# Patient Record
Sex: Female | Born: 1970
Health system: Southern US, Community
[De-identification: ages and names within clinical notes are randomized; demographics above are authoritative.]

## PROBLEM LIST (undated history)

## (undated) DIAGNOSIS — E039 Hypothyroidism, unspecified: Secondary | ICD-10-CM

## (undated) DIAGNOSIS — F419 Anxiety disorder, unspecified: Secondary | ICD-10-CM

---

## 1998-08-29 ENCOUNTER — Other Ambulatory Visit: Admission: RE | Admit: 1998-08-29 | Discharge: 1998-08-29 | Payer: Self-pay | Admitting: Obstetrics and Gynecology

## 1999-01-22 ENCOUNTER — Inpatient Hospital Stay (HOSPITAL_COMMUNITY): Admission: AD | Admit: 1999-01-22 | Discharge: 1999-01-24 | Payer: Self-pay | Admitting: *Deleted

## 1999-01-23 ENCOUNTER — Encounter: Payer: Self-pay | Admitting: *Deleted

## 1999-01-24 ENCOUNTER — Encounter: Payer: Self-pay | Admitting: *Deleted

## 1999-03-17 ENCOUNTER — Inpatient Hospital Stay (HOSPITAL_COMMUNITY): Admission: AD | Admit: 1999-03-17 | Discharge: 1999-03-20 | Payer: Self-pay | Admitting: Obstetrics and Gynecology

## 1999-04-26 ENCOUNTER — Other Ambulatory Visit: Admission: RE | Admit: 1999-04-26 | Discharge: 1999-04-26 | Payer: Self-pay | Admitting: Obstetrics and Gynecology

## 2000-05-06 ENCOUNTER — Other Ambulatory Visit: Admission: RE | Admit: 2000-05-06 | Discharge: 2000-05-06 | Payer: Self-pay | Admitting: Obstetrics and Gynecology

## 2001-04-21 ENCOUNTER — Other Ambulatory Visit: Admission: RE | Admit: 2001-04-21 | Discharge: 2001-04-21 | Payer: Self-pay | Admitting: Obstetrics and Gynecology

## 2001-06-24 ENCOUNTER — Observation Stay (HOSPITAL_COMMUNITY): Admission: AD | Admit: 2001-06-24 | Discharge: 2001-06-24 | Payer: Self-pay | Admitting: Obstetrics & Gynecology

## 2001-11-12 ENCOUNTER — Inpatient Hospital Stay (HOSPITAL_COMMUNITY): Admission: AD | Admit: 2001-11-12 | Discharge: 2001-11-14 | Payer: Self-pay | Admitting: Obstetrics and Gynecology

## 2001-11-22 ENCOUNTER — Inpatient Hospital Stay (HOSPITAL_COMMUNITY): Admission: AD | Admit: 2001-11-22 | Discharge: 2001-11-22 | Payer: Self-pay | Admitting: Obstetrics and Gynecology

## 2001-12-30 ENCOUNTER — Other Ambulatory Visit: Admission: RE | Admit: 2001-12-30 | Discharge: 2001-12-30 | Payer: Self-pay | Admitting: Obstetrics and Gynecology

## 2003-03-09 ENCOUNTER — Other Ambulatory Visit: Admission: RE | Admit: 2003-03-09 | Discharge: 2003-03-09 | Payer: Self-pay | Admitting: Obstetrics and Gynecology

## 2003-03-16 ENCOUNTER — Ambulatory Visit (HOSPITAL_COMMUNITY): Admission: RE | Admit: 2003-03-16 | Discharge: 2003-03-16 | Payer: Self-pay | Admitting: Family Medicine

## 2003-03-16 ENCOUNTER — Encounter: Payer: Self-pay | Admitting: Family Medicine

## 2004-11-28 ENCOUNTER — Inpatient Hospital Stay (HOSPITAL_COMMUNITY): Admission: AD | Admit: 2004-11-28 | Discharge: 2004-11-30 | Payer: Self-pay | Admitting: Obstetrics and Gynecology

## 2007-07-01 ENCOUNTER — Inpatient Hospital Stay (HOSPITAL_COMMUNITY): Admission: AD | Admit: 2007-07-01 | Discharge: 2007-07-01 | Payer: Self-pay | Admitting: *Deleted

## 2007-08-08 ENCOUNTER — Encounter: Admission: RE | Admit: 2007-08-08 | Discharge: 2007-08-08 | Payer: Self-pay | Admitting: Obstetrics and Gynecology

## 2007-12-10 ENCOUNTER — Inpatient Hospital Stay (HOSPITAL_COMMUNITY): Admission: AD | Admit: 2007-12-10 | Discharge: 2007-12-11 | Payer: Self-pay | Admitting: Obstetrics

## 2007-12-12 ENCOUNTER — Encounter: Admission: RE | Admit: 2007-12-12 | Discharge: 2007-12-15 | Payer: Self-pay | Admitting: Obstetrics and Gynecology

## 2010-10-24 NOTE — H&P (Signed)
Brianna Hodge, Brianna Hodge               ACCOUNT NO.:  [id number]   MEDICAL RECORD NO.:  [id number]          PATIENT TYPE:  INP   LOCATION:  9134                          FACILITY:  [facility]   PHYSICIAN:  Lenoard Aden, M.D.DATE OF BIRTH:  Aug 29, 1970   DATE OF ADMISSION:  12/10/2007  DATE OF DISCHARGE:                              HISTORY & PHYSICAL   CHIEF COMPLAINT:  Labor.   HISTORY OF PRESENT ILLNESS:  She is a 40 year old white female G4, P3 at  74th weeks' gestation who presents to spontaneous onset of labor.  She  has a history of LGA and polyhydramnios.   ALLERGIES:  She has no known drug allergies.   MEDICATIONS:  Prenatal vitamins and Effexor.   PAST MEDICAL HISTORY:  She has history of complicated depression, on  long-term Effexor use.   SOCIAL HISTORY:  She is a nonsmoker and nondrinker.  She denies domestic  or physical violence.  She has a history of 3 complicated vaginal  deliveries.  One complicated by polyhydramnios and two complicated by  oligohydramnios.   FAMILY HISTORY:  Remarkable for heart disease, diabetes, and OCD.   PHYSICAL EXAMINATION:  GENERAL:  She is well-developed, well-nourished  white female in no acute distress.  HEENT:  Normal.  LUNGS:  Clear.  HEART:  Regular rhythm.  ABDOMEN:  Soft.  Gravid.  Nontender.  Estimated fetal weight 9-1/2 to 10  pounds.  Cervix is 5, 100% vertex at a zero station.  EXTREMITIES:  There are no cords.  NEUROLOGIC:  Nonfocal.  SKIN:  Intact.   IMPRESSION:  Term intrauterine pregnancy in active labor, probable  macrosomia.   PLAN:  Anticipate to attempt vaginal delivery.      Lenoard Aden, M.D.  Electronically Signed    RJT/MEDQ  D:  12/10/2007  T:  12/10/2007  Job:  [id number]

## 2010-10-27 NOTE — H&P (Signed)
[facility]  Patient:    Brianna Hodge, Brianna Hodge Visit Number: [id number] MRN: [id number]          Service Type: OBS Location: 910B 9162 01 Attending Physician:  Maxie Better Dictated by:   Lenoard Aden, M.D. Admit Date:  11/12/2001   CC:         [facility]   History and Physical  CHIEF COMPLAINT:              Oligohydramnios.  HISTORY OF PRESENT ILLNESS:   This is a 40 year old white female, G2 P15, EDD November 22, 2001, at 39 weeks, with AFI less than the 10th percentile, at 39+ weeks for induction.  ALLERGIES:                    AMOXICILLIN.  MEDICATIONS:                  Prenatal vitamins.  OBSTETRICAL HISTORY:          History of 7 pounds 3 ounce female born in October 2002.  No other medical or surgical hospitalizations.  PAST MEDICAL HISTORY:         1. History of mitral valve prolapse.  She has                                  had a normal echocardiogram, normal work-up.                               2. Previous history of anxiety, on Luvox.                               3. No other medical or surgical hospitalization.  PRENATAL LABORATORY DATA:     Blood type A-negative.  Rh antibody negative. Toxo titer negative.  Rubella immune.  Hepatitis B surface antigen negative. GC and Chlamydia negative.  GBS negative.  PHYSICAL EXAMINATION:  GENERAL:                      She is a well-developed, well-nourished white female in no apparent distress.  HEENT:                        Normal.  LUNGS:                        Clear.  HEART:                        Regular rate and rhythm.  ABDOMEN:                      Soft, gravid, nontender.  PELVIC:                       Cervix is 4-5 cm, 2 cm long, soft, vertex -1. Artificial rupture of membranes reveals light meconium.  EXTREMITIES:                  No cords.  NEUROLOGIC:                   Examination nonfocal.  IMPRESSION:  1. Term intrauterine pregnancy  with                                  oligohydramnios.                               2. Light meconium.                               3. Spontaneous rupture of membranes.  PLAN:                         1. Proceed with Pitocin augmentation.                               2. Anticipate attempts at vaginal delivery.                               3. Epidural p.r.n. Dictated by:   Lenoard Aden, M.D. Attending Physician:  Maxie Better DD:  11/12/01 TD:  11/12/01 Job: [id number]

## 2010-10-27 NOTE — H&P (Signed)
[facility]  Patient:    Brianna Hodge                       MRN: [id number] Adm. Date:  [id number] Attending:  Lenoard Aden CC:         [facility]                         History and Physical  ADMISSION DIAGNOSIS:  Oligohydramnios.  HISTORY OF PRESENT ILLNESS:  A 40 year old white female, G1, P0.  Estimated date of delivery April 01, 1999 at 37+ weeks, who presents with persistent oligohydramnios.  PAST MEDICAL HISTORY:  Contributory for a history of MVP with normal cardiac echocardiogram.  History of depression on Luvox.  MEDICATIONS:  Prenatal vitamins, iron and Luvox.  ALLERGIES:  AMOXICILLIN, which gives her a rash.  FAMILY HISTORY:  Heart disease, diabetes and brain tumor.  PREGNANCY DATA:  Blood type A-, Rh antibody negative, toxoplasmosis negative, VDRL nonreactive, rubella immune, hepatitis B surface antigen negative.  GBS performed was negative.  Pregnancy complicated by depression on stable on Luvox and history of unexplained second trimester bleeding and history of oligohydramnios.  PHYSICAL EXAMINATION:  GENERAL:  Well-developed, well-nourished white female.  No apparent distress.  HEENT:  Normal.  LUNGS:  Clear.  HEART:  Regular rate and rhythm.  ABDOMEN:  Soft.  Gravid and nontender.  Estimated fetal weight of 7-1/2 pounds.    PELVIC:  The cervix is 2-3 cm, 2.5 cm on vertex -1.  Artificial rupture of membranes with minimal, but clear fluid.  EXTREMITIES:  There are no cords.  NEUROLOGIC:  Nonfocal.  IMPRESSION: 1. A 37-week intrauterine pregnancy. 2. Persistent oligohydramnios. 3. Unexplained second trimester bleeding.  PLAN:  Proceed with artificial rupture of membranes and Pitocin induction. DD:  03/17/99 TD:  03/17/99 Job: [id number]

## 2010-10-27 NOTE — Consult Note (Signed)
[facility]  Patient:    Brianna Hodge, Brianna Hodge Visit Number: [id number] MRN: [id number]          Service Type: OBS Location: 9300 9306 01 Attending Physician:  Genia Del Dictated by:   Lenoard Aden, M.D. Consultation Date: 06/24/01 Admit Date:  06/24/2001 Discharge Date: 06/24/2001   CC:         [facility]   Consultation Report  EMERGENCY ROOM  CHIEF COMPLAINT:              Nausea and vomiting, possible GI illness.  HISTORY OF PRESENT ILLNESS:   The patient is a 40 year old Caucasian female, G2, P1, [redacted] weeks gestation with a 24-hour history of nausea, vomiting, diarrhea, and questionable viral gastroenteritis.  PAST MEDICAL HISTORY:         Remarkable for vaginal delivery.  FAMILY HISTORY:               Noncontributory.  SOCIAL HISTORY:               Noncontributory.  ALLERGIES:                    AMOXICILLIN.  MEDICATIONS:                  Prenatal vitamins.  PHYSICAL EXAMINATION:  GENERAL:                      She is a well-developed, well-nourished, white female in no apparent distress.  VITAL SIGNS:                  Blood pressure 110/60.  NECK/BACK:                    No thyromegaly.  No CVA tenderness.  ABDOMEN:                      Soft, gravid, nontender.  Bowel sounds x 4 heard.  EXTREMITIES:                  No cords.  NEUROLOGIC:                   Nonfocal.  SKIN:                         Intact.  PELVIC:                       Cervical exam reveals cervix to be closed, long, presenting part out of the pelvis.  Fetal heart tones are noted.  LABORATORY DATA:              Urinalysis consistent with trace ketones and specific gravity greater than 1.030.  Laboratory values pending.  IMPRESSION:                   Viral gastroenteritis.  PLAN:                         IV fluids with Zofran IV, Imodium p.r.n.. Discharge home on Zofran ODT 8 mg p.o. t.i.d.  Follow up in office in one to two weeks or earlier  as needed.  Fluid hydration discussed. Dictated by:   Lenoard Aden, M.D. Attending Physician:  Genia Del DD:  06/24/01 TD:  06/25/01 Job: [id number]

## 2010-10-27 NOTE — H&P (Signed)
Brianna Hodge, Brianna Hodge               ACCOUNT NO.:  [id number]   MEDICAL RECORD NO.:  [id number]          PATIENT TYPE:  INP   LOCATION:  9165                          FACILITY:  [facility]   PHYSICIAN:  Lenoard Aden, M.D.DATE OF BIRTH:  06-20-1970   DATE OF ADMISSION:  11/28/2004  DATE OF DISCHARGE:                                HISTORY & PHYSICAL   CHIEF COMPLAINT:  Presumptive LGAfor induction.   HISTORY OF PRESENT ILLNESS:  A 40 year old white female, G3, P2, EDD December 10, 2004, at 38+ weeks for induction.  She has a history of allergies to  AMOXICILLIN.  Medications are prenatal vitamins, Lexapro.  History of 7  pound 3 ounce female in 2000 and 8 pound female in 2003.  No other medical  or surgical hospitalizations.  She has a history of anxiety for which she  takes Lexapro.   FAMILY HISTORY:  Diabetes, heart disease, no other medical or surgical  hospitalizations.   PRENATAL LABORATORY DATA:  Within normal limits.  GBS is negative.   PHYSICAL EXAMINATION:  HEENT:  Normal.  LUNGS:  Clear.  HEART:  Regular rate and rhythm.  ABDOMEN:  Soft, gravid, nontender.  Estimated fetal weight about 8.5 pounds.  PELVIC:  Cervix is 2 cm, 50% vertex and -2.  EXTREMITIES:  No cords.  NEUROLOGIC:  Nonfocal.   IMPRESSION:  1.  A 38 week intrauterine pregnancy.  2.  Presumptive large for gestational age with favorable cervix for      induction.  3.  History of depression, on antidepressant therapy.  Withdrawal symptoms      previously discussed.   PLAN:  Proceed with Pitocin induction.  Risks and benefits discussed.  Epidural as needed.       RJT/MEDQ  D:  11/28/2004  T:  11/28/2004  Job:  [id number]

## 2011-03-01 LAB — URINALYSIS, ROUTINE W REFLEX MICROSCOPIC
Bilirubin Urine: NEGATIVE
Glucose, UA: NEGATIVE
Hgb urine dipstick: NEGATIVE
Ketones, ur: NEGATIVE
Nitrite: NEGATIVE
Protein, ur: NEGATIVE
Specific Gravity, Urine: 1.015
Urobilinogen, UA: 0.2
pH: 7

## 2011-03-01 LAB — URINE CULTURE: Colony Count: 80000

## 2011-03-08 LAB — CBC
HCT: 28.7 — ABNORMAL LOW
HCT: 32 — ABNORMAL LOW
Hemoglobin: 10.7 — ABNORMAL LOW
MCHC: 33.3
MCV: 84.5
MCV: 84.6
Platelets: 245
Platelets: 297
RBC: 3.79 — ABNORMAL LOW
RDW: 15.2
RDW: 15.2
WBC: 14.6 — ABNORMAL HIGH

## 2011-11-19 ENCOUNTER — Other Ambulatory Visit: Payer: Self-pay | Admitting: Gynecology

## 2011-11-19 DIAGNOSIS — R928 Other abnormal and inconclusive findings on diagnostic imaging of breast: Secondary | ICD-10-CM

## 2011-12-03 ENCOUNTER — Ambulatory Visit
Admission: RE | Admit: 2011-12-03 | Discharge: 2011-12-03 | Disposition: A | Discharge status: Home / Self Care | Payer: BC Managed Care – PPO | Source: Ambulatory Visit | Attending: Gynecology | Admitting: Gynecology

## 2011-12-03 DIAGNOSIS — R928 Other abnormal and inconclusive findings on diagnostic imaging of breast: Secondary | ICD-10-CM

## 2012-08-25 ENCOUNTER — Other Ambulatory Visit: Payer: Self-pay | Admitting: Gynecology

## 2012-09-05 ENCOUNTER — Ambulatory Visit
Admission: RE | Admit: 2012-09-05 | Discharge: 2012-09-05 | Disposition: A | Discharge status: Home / Self Care | Payer: BC Managed Care – PPO | Source: Ambulatory Visit | Attending: Gynecology | Admitting: Gynecology

## 2012-09-05 DIAGNOSIS — R921 Mammographic calcification found on diagnostic imaging of breast: Secondary | ICD-10-CM

## 2015-06-14 MED FILL — LARIN FE 1-20 TABLET: 1-20 | 56 days supply | Qty: 56 | Fill #0

## 2015-06-23 MED FILL — VENLAFAXINE HCL ER 150 MG C: 150 | 30 days supply | Qty: 30 | Fill #0

## 2015-07-08 MED FILL — VENLAFAXINE HCL ER 150 MG C: 150 | 30 days supply | Qty: 30 | Fill #1

## 2015-08-03 MED FILL — VENLAFAXINE HCL ER 150 MG C: 150 | 30 days supply | Qty: 30 | Fill #2

## 2015-08-17 MED FILL — NORETHIN-ESTRAD-FERR 1-0.02: 1-20 | 28 days supply | Qty: 28 | Fill #0

## 2015-09-05 MED FILL — VENLAFAXINE HCL ER 150 MG C: 150 | 30 days supply | Qty: 30 | Fill #0

## 2015-10-05 MED FILL — NORETHIN-ESTRAD-FERR 1-0.02: 1-20 | 28 days supply | Qty: 28 | Fill #0

## 2015-10-05 MED FILL — VENLAFAXINE HCL ER 150 MG C: 150 | 30 days supply | Qty: 30 | Fill #0

## 2015-10-25 ENCOUNTER — Other Ambulatory Visit: Payer: Self-pay | Admitting: Nurse Practitioner

## 2015-10-25 DIAGNOSIS — Z1329 Encounter for screening for other suspected endocrine disorder: Secondary | ICD-10-CM | POA: Diagnosis not present

## 2015-10-25 DIAGNOSIS — N6002 Solitary cyst of left breast: Secondary | ICD-10-CM | POA: Diagnosis not present

## 2015-10-25 DIAGNOSIS — Z1321 Encounter for screening for nutritional disorder: Secondary | ICD-10-CM | POA: Diagnosis not present

## 2015-10-25 DIAGNOSIS — N63 Unspecified lump in unspecified breast: Secondary | ICD-10-CM

## 2015-10-25 DIAGNOSIS — Z6826 Body mass index (BMI) 26.0-26.9, adult: Secondary | ICD-10-CM | POA: Diagnosis not present

## 2015-10-25 DIAGNOSIS — Z130 Encounter for screening for diseases of the blood and blood-forming organs and certain disorders involving the immune mechanism: Secondary | ICD-10-CM | POA: Diagnosis not present

## 2015-10-25 DIAGNOSIS — Z01419 Encounter for gynecological examination (general) (routine) without abnormal findings: Secondary | ICD-10-CM | POA: Diagnosis not present

## 2015-10-25 DIAGNOSIS — E6609 Other obesity due to excess calories: Secondary | ICD-10-CM | POA: Diagnosis not present

## 2015-10-25 DIAGNOSIS — Z3040 Encounter for surveillance of contraceptives, unspecified: Secondary | ICD-10-CM | POA: Diagnosis not present

## 2015-10-25 DIAGNOSIS — R635 Abnormal weight gain: Secondary | ICD-10-CM | POA: Diagnosis not present

## 2015-10-25 DIAGNOSIS — Z13220 Encounter for screening for lipoid disorders: Secondary | ICD-10-CM | POA: Diagnosis not present

## 2015-11-01 ENCOUNTER — Other Ambulatory Visit: Payer: Self-pay | Admitting: Nurse Practitioner

## 2015-11-01 ENCOUNTER — Ambulatory Visit
Admission: RE | Admit: 2015-11-01 | Discharge: 2015-11-01 | Disposition: A | Discharge status: Home / Self Care | Payer: 59 | Source: Ambulatory Visit | Attending: Nurse Practitioner | Admitting: Nurse Practitioner

## 2015-11-01 DIAGNOSIS — N63 Unspecified lump in unspecified breast: Secondary | ICD-10-CM

## 2015-11-01 DIAGNOSIS — N6012 Diffuse cystic mastopathy of left breast: Secondary | ICD-10-CM | POA: Diagnosis not present

## 2015-11-09 ENCOUNTER — Other Ambulatory Visit: Payer: Self-pay | Admitting: Nurse Practitioner

## 2015-11-09 DIAGNOSIS — N63 Unspecified lump in unspecified breast: Secondary | ICD-10-CM

## 2015-11-09 MED FILL — VENLAFAXINE HCL ER 150 MG C: 150 | 30 days supply | Qty: 30 | Fill #0

## 2015-11-09 MED FILL — NORETHIN-ESTRAD-FERR 1-0.02: 1-20 | 84 days supply | Qty: 84 | Fill #0

## 2015-11-10 ENCOUNTER — Ambulatory Visit
Admission: RE | Admit: 2015-11-10 | Discharge: 2015-11-10 | Disposition: A | Discharge status: Home / Self Care | Payer: 59 | Source: Ambulatory Visit | Attending: Nurse Practitioner | Admitting: Nurse Practitioner

## 2015-11-10 DIAGNOSIS — N63 Unspecified lump in unspecified breast: Secondary | ICD-10-CM

## 2015-11-10 DIAGNOSIS — D242 Benign neoplasm of left breast: Secondary | ICD-10-CM | POA: Diagnosis not present

## 2015-11-10 HISTORY — PX: BREAST BIOPSY: SHX20

## 2015-11-14 ENCOUNTER — Other Ambulatory Visit: Payer: Self-pay | Admitting: Nurse Practitioner

## 2015-11-14 DIAGNOSIS — N6320 Unspecified lump in the left breast, unspecified quadrant: Secondary | ICD-10-CM

## 2015-11-17 ENCOUNTER — Other Ambulatory Visit: Payer: 59

## 2015-12-01 ENCOUNTER — Other Ambulatory Visit: Payer: Self-pay | Admitting: General Surgery

## 2015-12-01 DIAGNOSIS — D242 Benign neoplasm of left breast: Secondary | ICD-10-CM

## 2015-12-09 MED FILL — VENLAFAXINE HCL ER 150 MG C: 150 | 90 days supply | Qty: 90 | Fill #0

## 2015-12-28 ENCOUNTER — Other Ambulatory Visit: Payer: Self-pay | Admitting: General Surgery

## 2015-12-28 ENCOUNTER — Encounter (HOSPITAL_BASED_OUTPATIENT_CLINIC_OR_DEPARTMENT_OTHER): Payer: Self-pay | Admitting: *Deleted

## 2015-12-28 DIAGNOSIS — D242 Benign neoplasm of left breast: Secondary | ICD-10-CM

## 2016-01-04 ENCOUNTER — Ambulatory Visit
Admission: RE | Admit: 2016-01-04 | Discharge: 2016-01-04 | Disposition: A | Discharge status: Home / Self Care | Payer: 59 | Source: Ambulatory Visit | Attending: General Surgery | Admitting: General Surgery

## 2016-01-04 DIAGNOSIS — D242 Benign neoplasm of left breast: Secondary | ICD-10-CM | POA: Diagnosis not present

## 2016-01-05 ENCOUNTER — Ambulatory Visit (HOSPITAL_BASED_OUTPATIENT_CLINIC_OR_DEPARTMENT_OTHER): Payer: 59 | Admitting: Anesthesiology

## 2016-01-05 ENCOUNTER — Ambulatory Visit
Admission: RE | Admit: 2016-01-05 | Discharge: 2016-01-05 | Disposition: A | Discharge status: Home / Self Care | Payer: 59 | Source: Ambulatory Visit | Attending: General Surgery | Admitting: General Surgery

## 2016-01-05 ENCOUNTER — Encounter (HOSPITAL_BASED_OUTPATIENT_CLINIC_OR_DEPARTMENT_OTHER)
Admission: RE | Disposition: A | Discharge status: Home / Self Care | Payer: Self-pay | Source: Ambulatory Visit | Attending: General Surgery

## 2016-01-05 ENCOUNTER — Encounter (HOSPITAL_BASED_OUTPATIENT_CLINIC_OR_DEPARTMENT_OTHER): Payer: Self-pay | Admitting: Anesthesiology

## 2016-01-05 ENCOUNTER — Ambulatory Visit (HOSPITAL_BASED_OUTPATIENT_CLINIC_OR_DEPARTMENT_OTHER)
Admission: RE | Admit: 2016-01-05 | Discharge: 2016-01-05 | Disposition: A | Discharge status: Home / Self Care | Payer: 59 | Source: Ambulatory Visit | Attending: General Surgery | Admitting: General Surgery

## 2016-01-05 DIAGNOSIS — E039 Hypothyroidism, unspecified: Secondary | ICD-10-CM | POA: Insufficient documentation

## 2016-01-05 DIAGNOSIS — D242 Benign neoplasm of left breast: Secondary | ICD-10-CM

## 2016-01-05 DIAGNOSIS — N6012 Diffuse cystic mastopathy of left breast: Secondary | ICD-10-CM | POA: Diagnosis not present

## 2016-01-05 DIAGNOSIS — R928 Other abnormal and inconclusive findings on diagnostic imaging of breast: Secondary | ICD-10-CM | POA: Diagnosis not present

## 2016-01-05 HISTORY — DX: Anxiety disorder, unspecified: F41.9

## 2016-01-05 HISTORY — DX: Hypothyroidism, unspecified: E03.9

## 2016-01-05 HISTORY — PX: BREAST LUMPECTOMY WITH RADIOACTIVE SEED LOCALIZATION: SHX6424

## 2016-01-05 HISTORY — PX: BREAST EXCISIONAL BIOPSY: SUR124

## 2016-01-05 SURGERY — BREAST LUMPECTOMY WITH RADIOACTIVE SEED LOCALIZATION
Anesthesia: General | Site: Breast | Laterality: Left

## 2016-01-05 MED ORDER — HYDROCODONE-ACETAMINOPHEN 5-325 MG PO TABS: 1.0000 | ORAL_TABLET | Freq: Four times a day (QID) | ORAL | 0 refills | Status: DC | PRN

## 2016-01-05 MED ORDER — SCOPOLAMINE 1 MG/3DAYS TD PT72
MEDICATED_PATCH | TRANSDERMAL | Status: AC
  Filled 2016-01-05: qty 1

## 2016-01-05 MED ORDER — LIDOCAINE 2% (20 MG/ML) 5 ML SYRINGE
INTRAMUSCULAR | Status: DC | PRN
  Administered 2016-01-05: 100 mg via INTRAVENOUS

## 2016-01-05 MED ORDER — GLYCOPYRROLATE 0.2 MG/ML IJ SOLN: 0.2000 mg | Freq: Once | INTRAMUSCULAR | Status: DC | PRN

## 2016-01-05 MED ORDER — CEFAZOLIN SODIUM-DEXTROSE 2-4 GM/100ML-% IV SOLN
2.0000 g | INTRAVENOUS | Status: AC
  Administered 2016-01-05: 2 g via INTRAVENOUS

## 2016-01-05 MED ORDER — BUPIVACAINE-EPINEPHRINE (PF) 0.25% -1:200000 IJ SOLN
INTRAMUSCULAR | Status: AC
  Filled 2016-01-05: qty 30

## 2016-01-05 MED ORDER — MIDAZOLAM HCL 2 MG/2ML IJ SOLN
1.0000 mg | INTRAMUSCULAR | Status: DC | PRN
  Administered 2016-01-05: 2 mg via INTRAVENOUS

## 2016-01-05 MED ORDER — MIDAZOLAM HCL 2 MG/2ML IJ SOLN
INTRAMUSCULAR | Status: AC
  Filled 2016-01-05: qty 2

## 2016-01-05 MED ORDER — BUPIVACAINE HCL (PF) 0.25 % IJ SOLN
INTRAMUSCULAR | Status: DC | PRN
  Administered 2016-01-05: 20 mL

## 2016-01-05 MED ORDER — PROPOFOL 10 MG/ML IV BOLUS
INTRAVENOUS | Status: DC | PRN
  Administered 2016-01-05: 200 mg via INTRAVENOUS

## 2016-01-05 MED ORDER — FENTANYL CITRATE (PF) 100 MCG/2ML IJ SOLN
50.0000 ug | INTRAMUSCULAR | Status: DC | PRN
  Administered 2016-01-05: 100 ug via INTRAVENOUS

## 2016-01-05 MED ORDER — ONDANSETRON HCL 4 MG/2ML IJ SOLN
INTRAMUSCULAR | Status: DC | PRN
  Administered 2016-01-05: 4 mg via INTRAVENOUS

## 2016-01-05 MED ORDER — FENTANYL CITRATE (PF) 100 MCG/2ML IJ SOLN
25.0000 ug | INTRAMUSCULAR | Status: DC | PRN
  Administered 2016-01-05: 50 ug via INTRAVENOUS

## 2016-01-05 MED ORDER — ONDANSETRON HCL 4 MG/2ML IJ SOLN: 4.0000 mg | Freq: Once | INTRAMUSCULAR | Status: DC | PRN

## 2016-01-05 MED ORDER — FENTANYL CITRATE (PF) 100 MCG/2ML IJ SOLN
INTRAMUSCULAR | Status: AC
  Filled 2016-01-05: qty 2

## 2016-01-05 MED ORDER — SCOPOLAMINE 1 MG/3DAYS TD PT72
1.0000 | MEDICATED_PATCH | Freq: Once | TRANSDERMAL | Status: DC | PRN
  Administered 2016-01-05: 1.5 mg via TRANSDERMAL

## 2016-01-05 MED ORDER — CHLORHEXIDINE GLUCONATE CLOTH 2 % EX PADS: 6.0000 | MEDICATED_PAD | Freq: Once | CUTANEOUS | Status: DC

## 2016-01-05 MED ORDER — ONDANSETRON HCL 4 MG/2ML IJ SOLN
INTRAMUSCULAR | Status: AC
  Filled 2016-01-05: qty 2

## 2016-01-05 MED ORDER — DEXAMETHASONE SODIUM PHOSPHATE 4 MG/ML IJ SOLN
INTRAMUSCULAR | Status: DC | PRN
  Administered 2016-01-05: 10 mg via INTRAVENOUS

## 2016-01-05 MED ORDER — BUPIVACAINE HCL (PF) 0.25 % IJ SOLN
INTRAMUSCULAR | Status: AC
  Filled 2016-01-05: qty 30

## 2016-01-05 MED ORDER — LACTATED RINGERS IV SOLN
INTRAVENOUS | Status: DC
  Administered 2016-01-05 (×3): via INTRAVENOUS

## 2016-01-05 MED ORDER — DEXAMETHASONE SODIUM PHOSPHATE 10 MG/ML IJ SOLN
INTRAMUSCULAR | Status: AC
  Filled 2016-01-05: qty 1

## 2016-01-05 MED ORDER — CEFAZOLIN SODIUM 1 G IJ SOLR
INTRAMUSCULAR | Status: AC
  Filled 2016-01-05: qty 20

## 2016-01-05 MED ORDER — LIDOCAINE 2% (20 MG/ML) 5 ML SYRINGE
INTRAMUSCULAR | Status: AC
  Filled 2016-01-05: qty 5

## 2016-01-05 MED FILL — HYDROCODON-APAP 5-325: 5-325 | 3 days supply | Qty: 30 | Fill #0

## 2016-01-05 SURGICAL SUPPLY — 45 items
APPLIER CLIP 9.375 MED OPEN (MISCELLANEOUS)
APR CLP MED 9.3 20 MLT OPN (MISCELLANEOUS)
BLADE SURG 15 STRL LF DISP TIS (BLADE) ×1 IMPLANT
BLADE SURG 15 STRL SS (BLADE) ×3
CANISTER SUC SOCK COL 7IN (MISCELLANEOUS) ×1 IMPLANT
CANISTER SUCT 1200ML W/VALVE (MISCELLANEOUS) ×1 IMPLANT
CHLORAPREP W/TINT 26ML (MISCELLANEOUS) ×3 IMPLANT
CLIP APPLIE 9.375 MED OPEN (MISCELLANEOUS) IMPLANT
COVER BACK TABLE 60X90IN (DRAPES) ×3 IMPLANT
COVER MAYO STAND STRL (DRAPES) ×3 IMPLANT
COVER PROBE W GEL 5X96 (DRAPES) ×3 IMPLANT
DECANTER SPIKE VIAL GLASS SM (MISCELLANEOUS) ×2 IMPLANT
DEVICE DUBIN W/COMP PLATE 8390 (MISCELLANEOUS) ×3 IMPLANT
DRAPE LAPAROSCOPIC ABDOMINAL (DRAPES) ×2 IMPLANT
DRAPE UTILITY XL STRL (DRAPES) ×3 IMPLANT
ELECT COATED BLADE 2.86 ST (ELECTRODE) ×3 IMPLANT
ELECT REM PT RETURN 9FT ADLT (ELECTROSURGICAL) ×3
ELECTRODE REM PT RTRN 9FT ADLT (ELECTROSURGICAL) ×1 IMPLANT
GLOVE BIO SURGEON STRL SZ7.5 (GLOVE) ×8 IMPLANT
GLOVE BIOGEL PI IND STRL 7.0 (GLOVE) IMPLANT
GLOVE BIOGEL PI IND STRL 7.5 (GLOVE) IMPLANT
GLOVE BIOGEL PI INDICATOR 7.0 (GLOVE) ×2
GLOVE BIOGEL PI INDICATOR 7.5 (GLOVE) ×2
GOWN STRL REUS W/ TWL LRG LVL3 (GOWN DISPOSABLE) ×2 IMPLANT
GOWN STRL REUS W/TWL LRG LVL3 (GOWN DISPOSABLE) ×6
ILLUMINATOR WAVEGUIDE N/F (MISCELLANEOUS) IMPLANT
KIT MARKER MARGIN INK (KITS) ×3 IMPLANT
LIGHT WAVEGUIDE WIDE FLAT (MISCELLANEOUS) IMPLANT
LIQUID BAND (GAUZE/BANDAGES/DRESSINGS) ×3 IMPLANT
NDL HYPO 25X1 1.5 SAFETY (NEEDLE) IMPLANT
NEEDLE HYPO 25X1 1.5 SAFETY (NEEDLE) ×3 IMPLANT
NS IRRIG 1000ML POUR BTL (IV SOLUTION) ×2 IMPLANT
PACK BASIN DAY SURGERY FS (CUSTOM PROCEDURE TRAY) ×3 IMPLANT
PENCIL BUTTON HOLSTER BLD 10FT (ELECTRODE) ×3 IMPLANT
SLEEVE SCD COMPRESS KNEE MED (MISCELLANEOUS) ×3 IMPLANT
SPONGE LAP 18X18 X RAY DECT (DISPOSABLE) ×3 IMPLANT
SUT MON AB 4-0 PC3 18 (SUTURE) ×2 IMPLANT
SUT SILK 2 0 SH (SUTURE) IMPLANT
SUT VICRYL 3-0 CR8 SH (SUTURE) ×3 IMPLANT
SYR CONTROL 10ML LL (SYRINGE) ×2 IMPLANT
TOWEL OR 17X24 6PK STRL BLUE (TOWEL DISPOSABLE) ×3 IMPLANT
TOWEL OR NON WOVEN STRL DISP B (DISPOSABLE) ×3 IMPLANT
TUBE CONNECTING 20'X1/4 (TUBING) ×1
TUBE CONNECTING 20X1/4 (TUBING) ×2 IMPLANT
YANKAUER SUCT BULB TIP NO VENT (SUCTIONS) IMPLANT

## 2016-01-05 NOTE — Anesthesia Preprocedure Evaluation (Addendum)
Anesthesia Evaluation  Patient identified by MRN, date of birth, ID band Patient awake    Reviewed: Allergy & Precautions, NPO status , Patient's Chart, lab work & pertinent test results  History of Anesthesia Complications Negative for: history of anesthetic complications  Airway Mallampati: II  TM Distance: >3 FB Neck ROM: Full    Dental no notable dental hx. (+) Dental Advisory Given   Pulmonary neg pulmonary ROS,    Pulmonary exam normal breath sounds clear to auscultation       Cardiovascular negative cardio ROS Normal cardiovascular exam Rhythm:Regular Rate:Normal     Neuro/Psych negative neurological ROS  negative psych ROS   GI/Hepatic negative GI ROS, Neg liver ROS,   Endo/Other  Hypothyroidism   Renal/GU negative Renal ROS  negative genitourinary   Musculoskeletal negative musculoskeletal ROS (+)   Abdominal   Peds negative pediatric ROS (+)  Hematology negative hematology ROS (+)   Anesthesia Other Findings   Reproductive/Obstetrics negative OB ROS                            Anesthesia Physical Anesthesia Plan  ASA: II  Anesthesia Plan: General   Post-op Pain Management:    Induction: Intravenous  Airway Management Planned: LMA  Additional Equipment:   Intra-op Plan:   Post-operative Plan: Extubation in OR  Informed Consent: I have reviewed the patients History and Physical, chart, labs and discussed the procedure including the risks, benefits and alternatives for the proposed anesthesia with the patient or authorized representative who has indicated his/her understanding and acceptance.   Dental advisory given  Plan Discussed with: CRNA  Anesthesia Plan Comments:         Anesthesia Quick Evaluation

## 2016-01-05 NOTE — Discharge Instructions (Signed)

## 2016-01-05 NOTE — Anesthesia Postprocedure Evaluation (Signed)
Anesthesia Post Note  Patient: Brianna Hodge  Procedure(s) Performed: Procedure(s) (LRB): LEFT BREAST LUMPECTOMY WITH RADIOACTIVE SEED LOCALIZATION (Left)  Patient location during evaluation: PACU Anesthesia Type: General Level of consciousness: awake and alert and patient cooperative Pain management: pain level controlled Vital Signs Assessment: post-procedure vital signs reviewed and stable Respiratory status: spontaneous breathing and respiratory function stable Cardiovascular status: stable Anesthetic complications: no    Last Vitals:  Vitals:   01/05/16 1130 01/05/16 1145  BP: 123/86 124/87  Pulse: 92 92  Resp: (!) 6 17  Temp:      Last Pain:  Vitals:   01/05/16 1145  TempSrc:   PainSc: 2                  Alba Kriesel S

## 2016-01-05 NOTE — Transfer of Care (Signed)
Immediate Anesthesia Transfer of Care Note  Patient: Brianna Hodge  Procedure(s) Performed: Procedure(s): LEFT BREAST LUMPECTOMY WITH RADIOACTIVE SEED LOCALIZATION (Left)  Patient Location: PACU  Anesthesia Type:General  Level of Consciousness: awake  Airway & Oxygen Therapy: Patient Spontanous Breathing and Patient connected to face mask oxygen  Post-op Assessment: Report given to RN and Post -op Vital signs reviewed and stable  Post vital signs: Reviewed and stable  Last Vitals:  Vitals:   01/05/16 0931  BP: 130/74  Pulse: (!) 102  Resp: 18  Temp: 37.1 C    Last Pain:  Vitals:   01/05/16 0931  TempSrc: Oral      Patients Stated Pain Goal: 0 ([id number])  Complications: No apparent anesthesia complications

## 2016-01-05 NOTE — Anesthesia Procedure Notes (Signed)
Procedure Name: LMA Insertion Date/Time: 01/05/2016 10:26 AM Performed by: Lieutenant Diego Pre-anesthesia Checklist: Patient identified, Emergency Drugs available, Suction available and Patient being monitored Patient Re-evaluated:Patient Re-evaluated prior to inductionOxygen Delivery Method: Circle system utilized Preoxygenation: Pre-oxygenation with 100% oxygen Intubation Type: IV induction Ventilation: Mask ventilation without difficulty LMA: LMA inserted LMA Size: 4.0 Number of attempts: 1 Placement Confirmation: positive ETCO2 Tube secured with: Tape Dental Injury: Teeth and Oropharynx as per pre-operative assessment

## 2016-01-05 NOTE — Op Note (Signed)
01/05/2016  11:03 AM  PATIENT:  Brianna Hodge  45 y.o. female  PRE-OPERATIVE DIAGNOSIS:  Left breast papilloma   POST-OPERATIVE DIAGNOSIS:  Left breast papilloma   PROCEDURE:  Procedure(s): LEFT BREAST LUMPECTOMY WITH RADIOACTIVE SEED LOCALIZATION (Left)  SURGEON:  Surgeon(s) and Role:    * Jovita Kussmaul, MD - Primary  PHYSICIAN ASSISTANT:   ASSISTANTS: none   ANESTHESIA:   general  EBL:  Total I/O In: 1000 [I.V.:1000] Out: -   BLOOD ADMINISTERED:none  DRAINS: none   LOCAL MEDICATIONS USED:  MARCAINE     SPECIMEN:  Source of Specimen:  left breast tissue  DISPOSITION OF SPECIMEN:  PATHOLOGY  COUNTS:  YES  TOURNIQUET:  * No tourniquets in log *  DICTATION: .Dragon Dictation   After informed consent was obtained the patient was brought to the operating room and placed in the supine position on the operating table.  After adequate induction of general anesthesia the patient's left breast was prepped with ChloraPrep, allowed to dry, and draped in usual sterile manner. An appropriate timeout was performed. Previously an I-125 seed was placed in the lower inner quadrant of the left breast in the subareolar area to mark an area of an intraductal papilloma. The neoprobe was sent to I-125 in the area of radioactivity was readily identified. A curvilinear incision was made along the lower inner edge of the areola with a 15 blade knife. The incision was carried through the skin and subcutaneous tissue sharply with electrocautery. While checking the area of radioactivity frequently with the neoprobe a circular portion of breast tissue was excised sharply around the radioactive seed. Once the specimen was removed it was oriented with the appropriate paint colors. A specimen radiograph was obtained that showed the clip and seated to be in the center of the specimen. The specimen was then sent to pathology for further evaluation. Hemostasis was achieved using the Bovie electrocautery. The  wound was irrigated with saline and infiltrated with quarter percent Marcaine. The deep layer of the wound was then closed with layers of interrupted 3-0 Vicryl stitches. The skin was then closed with interrupted 4-0 Monocryl subcuticular stitches. Dermabond dressings were applied. The patient tolerated the procedure well. At the end of the case all needle sponge and instrument counts were correct. The patient was then awakened and taken to recovery in stable condition.  PLAN OF CARE: Discharge to home after PACU  PATIENT DISPOSITION:  PACU - hemodynamically stable.   Delay start of Pharmacological VTE agent (>24hrs) due to surgical blood loss or risk of bleeding: not applicable

## 2016-01-05 NOTE — Interval H&P Note (Signed)
History and Physical Interval Note:  01/05/2016 8:48 AM  Brianna Hodge  has presented today for surgery, with the diagnosis of Left breast papilloma   The various methods of treatment have been discussed with the patient and family. After consideration of risks, benefits and other options for treatment, the patient has consented to  Procedure(s): LEFT BREAST LUMPECTOMY WITH RADIOACTIVE SEED LOCALIZATION (Left) as a surgical intervention .  The patient's history has been reviewed, patient examined, no change in status, stable for surgery.  I have reviewed the patient's chart and labs.  Questions were answered to the patient's satisfaction.     TOTH III,Wynetta Seith S

## 2016-01-05 NOTE — H&P (Signed)
Brianna Hodge  Location: [facility] Patient #: [id number] DOB: 1970/08/12 Married / Language: English / Race: White Female   History of Present Illness  The patient is a 45 year old female who presents with a breast mass. We are asked to see the patient in consultation by Dr. Ammie Ferrier to evaluate her for a left breast intraductal papilloma. The patient is a 45 year old white female who recently went for a routine screening mammogram. At that time she was found to have some benign-appearing cysts as well as a filling defect in a duct in the lower inner subareolar left breast. This was biopsied and came back as a papilloma. She has had no significant breast problems in the past. She denies any breast pain or discharge from the nipple. She does not smoke.   Other Problems  Lump In Breast  Past Surgical History No pertinent past surgical history  Diagnostic Studies History  Colonoscopy never Mammogram within last year Pap Smear 1-5 years ago  Allergies  No Known Drug Allergies  Medication History Larin Fe 1/20 (1-20MG -MCG Tablet, Oral) Active. Venlafaxine HCl ER (150MG  Capsule ER 24HR, Oral) Active. Medications Reconciled  Social History  Alcohol use Occasional alcohol use. Caffeine use Coffee. No drug use Tobacco use Never smoker.  Family History  Cerebrovascular Accident Father. Diabetes Mellitus Mother. Melanoma Father. Migraine Headache Mother. Thyroid problems Father, Mother.  Pregnancy / Birth History  Age at menarche 46 years. Contraceptive History Intrauterine device, Oral contraceptives. Gravida 4 Irregular periods Length (months) of breastfeeding 12-24 Maternal age 26-30 Para 4    Review of Systems General Present- Night Sweats. Not Present- Appetite Loss, Chills, Fatigue, Fever, Weight Gain and Weight Loss. Skin Not Present- Change in Wart/Mole, Dryness, Hives, Jaundice, New Lesions, Non-Healing  Wounds, Rash and Ulcer. HEENT Not Present- Earache, Hearing Loss, Hoarseness, Nose Bleed, Oral Ulcers, Ringing in the Ears, Seasonal Allergies, Sinus Pain, Sore Throat, Visual Disturbances, Wears glasses/contact lenses and Yellow Eyes. Respiratory Not Present- Bloody sputum, Chronic Cough, Difficulty Breathing, Snoring and Wheezing. Breast Present- Breast Mass. Not Present- Breast Pain, Nipple Discharge and Skin Changes. Cardiovascular Not Present- Chest Pain, Difficulty Breathing Lying Down, Leg Cramps, Palpitations, Rapid Heart Rate, Shortness of Breath and Swelling of Extremities. Gastrointestinal Present- Constipation. Not Present- Abdominal Pain, Bloating, Bloody Stool, Change in Bowel Habits, Chronic diarrhea, Difficulty Swallowing, Excessive gas, Gets full quickly at meals, Hemorrhoids, Indigestion, Nausea, Rectal Pain and Vomiting. Female Genitourinary Not Present- Frequency, Nocturia, Painful Urination, Pelvic Pain and Urgency. Musculoskeletal Not Present- Back Pain, Joint Pain, Joint Stiffness, Muscle Pain, Muscle Weakness and Swelling of Extremities. Neurological Not Present- Decreased Memory, Fainting, Headaches, Numbness, Seizures, Tingling, Tremor, Trouble walking and Weakness. Psychiatric Present- Anxiety. Not Present- Bipolar, Change in Sleep Pattern, Depression, Fearful and Frequent crying. Endocrine Not Present- Cold Intolerance, Excessive Hunger, Hair Changes, Heat Intolerance, Hot flashes and New Diabetes. Hematology Not Present- Blood Thinners, Easy Bruising, Excessive bleeding, Gland problems, HIV and Persistent Infections.  Vitals  Weight: 167.8 lb Height: 67in Body Surface Area: 1.88 m Body Mass Index: 26.28 kg/m  Temp.: 98.36F  Pulse: 64 (Regular)  BP: 122/78 (Sitting, Left Arm, Standard)       Physical Exam  General Mental Status-Alert. General Appearance-Consistent with stated age. Hydration-Well hydrated. Voice-Normal.  Head and  Neck Head-normocephalic, atraumatic with no lesions or palpable masses. Trachea-midline. Thyroid Gland Characteristics - normal size and consistency.  Eye Eyeball - Bilateral-Extraocular movements intact. Sclera/Conjunctiva - Bilateral-No scleral icterus.  Chest and Lung Exam Chest and lung  exam reveals -quiet, even and easy respiratory effort with no use of accessory muscles and on auscultation, normal breath sounds, no adventitious sounds and normal vocal resonance. Inspection Chest Wall - Normal. Back - normal.  Breast Note: There is no palpable mass in either breast. There is no palpable axillary, supraclavicular, or cervical lymphadenopathy.   Cardiovascular Cardiovascular examination reveals -normal heart sounds, regular rate and rhythm with no murmurs and normal pedal pulses bilaterally.  Abdomen Inspection Inspection of the abdomen reveals - No Hernias. Skin - Scar - no surgical scars. Palpation/Percussion Palpation and Percussion of the abdomen reveal - Soft, Non Tender, No Rebound tenderness, No Rigidity (guarding) and No hepatosplenomegaly. Auscultation Auscultation of the abdomen reveals - Bowel sounds normal.  Neurologic Neurologic evaluation reveals -alert and oriented x 3 with no impairment of recent or remote memory. Mental Status-Normal.  Musculoskeletal Normal Exam - Left-Upper Extremity Strength Normal and Lower Extremity Strength Normal. Normal Exam - Right-Upper Extremity Strength Normal and Lower Extremity Strength Normal.  Lymphatic Head & Neck  General Head & Neck Lymphatics: Bilateral - Description - Normal. Axillary  General Axillary Region: Bilateral - Description - Normal. Tenderness - Non Tender. Femoral & Inguinal  Generalized Femoral & Inguinal Lymphatics: Bilateral - Description - Normal. Tenderness - Non Tender.    Assessment & Plan  PAPILLOMA OF LEFT BREAST (D24.2) Impression: The patient appears to have an  intraductal papilloma in the subareolar area of the lower inner left breast. Because of its abnormal appearance and because it can be considered a higher risk lesion I think she would benefit from having this area removed. She would also like to have this done. I have discussed with her in detail the risks and benefits of the operation to do this as well as some of the technical aspects and she understands and wishes to proceed. I will plan for a left breast radioactive seed localized lumpectomy Current Plans Pt Education - Breast Diseases: discussed with patient and provided information.

## 2016-01-05 NOTE — Interval H&P Note (Signed)
History and Physical Interval Note:  01/05/2016 10:15 AM  Brianna Hodge  has presented today for surgery, with the diagnosis of Left breast papilloma   The various methods of treatment have been discussed with the patient and family. After consideration of risks, benefits and other options for treatment, the patient has consented to  Procedure(s): LEFT BREAST LUMPECTOMY WITH RADIOACTIVE SEED LOCALIZATION (Left) as a surgical intervention .  The patient's history has been reviewed, patient examined, no change in status, stable for surgery.  I have reviewed the patient's chart and labs.  Questions were answered to the patient's satisfaction.     TOTH III,Emary Zalar S

## 2016-01-09 ENCOUNTER — Encounter (HOSPITAL_BASED_OUTPATIENT_CLINIC_OR_DEPARTMENT_OTHER): Payer: Self-pay | Admitting: General Surgery

## 2016-01-10 ENCOUNTER — Encounter: Payer: 59 | Attending: Obstetrics & Gynecology | Admitting: Dietician

## 2016-01-10 ENCOUNTER — Encounter: Payer: Self-pay | Admitting: Dietician

## 2016-01-10 DIAGNOSIS — R635 Abnormal weight gain: Secondary | ICD-10-CM

## 2016-01-10 DIAGNOSIS — Z713 Dietary counseling and surveillance: Secondary | ICD-10-CM | POA: Diagnosis not present

## 2016-01-10 NOTE — Progress Notes (Signed)
  Medical Nutrition Therapy:  Appt start time: 0810 end time:  0900.   Assessment:  Primary concerns today: Brianna Hodge is here today to discuss her weight. She reports she has been frustrated with her weight for a while. Feels like she began gaining weight around age 45. She has tried to adjust her way of eating and increase exercise. Was most content around 140 lbs, stating she felt healthy. Maricella understands that metabolism slows as people get older. She follows a gluten free diet and has for 2 years, feels like it helps with joint inflammation. She is a Consulting civil engineer at [employer]. Off for the summer, feels like she is active at work. Tries to walk about 2 miles most days. "Strong sugar craving," especially mid afternoon. Chronically constipated. Takes fiber chews and tries to drink plenty of water. She lives with her husband and their 4 kids, she does the grocery shopping and cooking. They usually eat at home, may go out on the weekends.  Lipid levels, thyroid levels, and HgbA1c all within normal limits.  Preferred Learning Style:   No preference indicated   Learning Readiness:   Ready   MEDICATIONS: see list   DIETARY INTAKE:  Usual eating pattern includes 3 meals and 1 snacks per day. Avoided foods include gluten, fried foods, high sugar foods.    24-hr recall: Wakes up around 8  B ( AM): 2 cups coffee with cream, no sugar; multigrain cheerios  Snk ( AM):   L ( PM): tuna and tortilla chips Snk ( PM): apple and peanut butter D ( PM): grilled chicken OR gluten free spaghetti OR [place] burgers Snk ( PM): none  Beverages: 1/2 sweet tea, coffee with cream, water, sugar free flavored water  Usual physical activity: 2 mile walk several times a week  Estimated energy needs: 1800-2000 calories  Progress Towards Goal(s):  In progress.   Nutritional Diagnosis:  [place]-3.4 Unintentional weight gain As related to increasing age causing decrease in metabolic rate and excessive  energy intake .  As evidenced by BMI.    Intervention:  Nutrition counseling provided. Goals: -Keep healthy snacks on hand to have in the afternoon -Include a protein food with each meal and snack   -Eggs, low fat cheese, nuts, nut butter, Greek yogurt -Include healthy fats (nuts, avocado, fish, olive oil) and limit saturated fats (animal fats, fried foods, baked goods like doughnuts) -Increase water intake  -Experiment with diet Solon Palm at [employer] taking your vitamin D -Talk to your doctor about taking 500 mg of magnesium for constipation -Continue activity level  -Refer to plate method  Teaching Method Utilized:  Visual Auditory Hands on  Handouts given during visit include:  MyPlate  Meal planning card  Barriers to learning/adherence to lifestyle change: none  Demonstrated degree of understanding via:  Teach Back   Monitoring/Evaluation:  Dietary intake, exercise, and body weight in 6 week(s).

## 2016-01-10 NOTE — Patient Instructions (Addendum)
-  Keep healthy snacks on hand to have in the afternoon  -Include a protein food with each meal and snack   -Eggs, low fat cheese, nuts, nut butter, Greek yogurt  -Include healthy fats (nuts, avocado, fish, olive oil) and limit saturated fats (animal fats, fried foods, baked goods like doughnuts)  -Increase water intake  -Experiment with diet Solon Palm at [employer] taking your vitamin D  -Try taking 500 mg of magnesium for constipation (speak with your physician first)  -Continue activity level   -Refer to plate method

## 2016-01-11 ENCOUNTER — Encounter: Payer: Self-pay | Admitting: Dietician

## 2016-02-22 MED FILL — VENLAFAXINE HCL ER 150 MG C: 150 | 30 days supply | Qty: 30 | Fill #0

## 2016-02-22 MED FILL — NORETHIN-ESTRAD-FERR 1-0.02: 1-20 | 84 days supply | Qty: 84 | Fill #1

## 2016-02-23 ENCOUNTER — Encounter: Payer: 59 | Admitting: Dietician

## 2016-03-29 MED FILL — VENLAFAXINE HCL ER 150 MG C: 150 | 30 days supply | Qty: 30 | Fill #1

## 2016-05-01 MED FILL — NORETHIN-ESTRAD-FERR 1-0.02: 1-20 | 84 days supply | Qty: 84 | Fill #2

## 2016-05-02 MED FILL — LEVOTHYROXINE 50 MCG TABLET: 50 | 30 days supply | Qty: 30 | Fill #0

## 2016-05-28 MED FILL — VENLAFAXINE HCL ER 150 MG C: 150 | 30 days supply | Qty: 30 | Fill #2

## 2016-06-05 MED FILL — LEVOTHYROXINE 50 MCG TABLET: 50 | 30 days supply | Qty: 30 | Fill #1

## 2016-06-28 MED FILL — VENLAFAXINE HCL ER 150 MG C: 150 | 30 days supply | Qty: 30 | Fill #3

## 2016-07-09 MED FILL — LEVOTHYROXINE 50 MCG TABLET: 50 | 30 days supply | Qty: 30 | Fill #2

## 2016-07-18 ENCOUNTER — Other Ambulatory Visit: Payer: Self-pay | Admitting: Internal Medicine

## 2016-07-18 DIAGNOSIS — Z131 Encounter for screening for diabetes mellitus: Secondary | ICD-10-CM | POA: Diagnosis not present

## 2016-07-18 DIAGNOSIS — E039 Hypothyroidism, unspecified: Secondary | ICD-10-CM | POA: Diagnosis not present

## 2016-07-18 DIAGNOSIS — Z833 Family history of diabetes mellitus: Secondary | ICD-10-CM | POA: Diagnosis not present

## 2016-07-18 DIAGNOSIS — R635 Abnormal weight gain: Secondary | ICD-10-CM | POA: Diagnosis not present

## 2016-07-18 DIAGNOSIS — Z8742 Personal history of other diseases of the female genital tract: Secondary | ICD-10-CM

## 2016-07-18 DIAGNOSIS — L709 Acne, unspecified: Secondary | ICD-10-CM | POA: Diagnosis not present

## 2016-07-18 DIAGNOSIS — E663 Overweight: Secondary | ICD-10-CM | POA: Diagnosis not present

## 2016-07-18 DIAGNOSIS — Z6826 Body mass index (BMI) 26.0-26.9, adult: Secondary | ICD-10-CM | POA: Diagnosis not present

## 2016-07-24 ENCOUNTER — Ambulatory Visit
Admission: RE | Admit: 2016-07-24 | Discharge: 2016-07-24 | Disposition: A | Discharge status: Home / Self Care | Payer: 59 | Source: Ambulatory Visit | Attending: Internal Medicine | Admitting: Internal Medicine

## 2016-07-24 DIAGNOSIS — N83201 Unspecified ovarian cyst, right side: Secondary | ICD-10-CM | POA: Diagnosis not present

## 2016-07-24 DIAGNOSIS — Z8742 Personal history of other diseases of the female genital tract: Secondary | ICD-10-CM

## 2016-07-24 DIAGNOSIS — N83202 Unspecified ovarian cyst, left side: Secondary | ICD-10-CM | POA: Diagnosis not present

## 2016-07-26 MED FILL — NORETHIN-ESTRAD-FERR 1-0.02: 1-20 | 84 days supply | Qty: 84 | Fill #3

## 2016-07-26 MED FILL — VENLAFAXINE HCL ER 150 MG C: 150 | 30 days supply | Qty: 30 | Fill #4

## 2016-08-27 MED FILL — VENLAFAXINE HCL ER 150 MG C: 150 | 30 days supply | Qty: 30 | Fill #5

## 2016-08-29 ENCOUNTER — Other Ambulatory Visit: Payer: Self-pay | Admitting: Internal Medicine

## 2016-08-29 DIAGNOSIS — Z8742 Personal history of other diseases of the female genital tract: Secondary | ICD-10-CM

## 2016-09-06 ENCOUNTER — Ambulatory Visit
Admission: RE | Admit: 2016-09-06 | Discharge: 2016-09-06 | Disposition: A | Discharge status: Home / Self Care | Payer: 59 | Source: Ambulatory Visit | Attending: Internal Medicine | Admitting: Internal Medicine

## 2016-09-06 DIAGNOSIS — Z8742 Personal history of other diseases of the female genital tract: Secondary | ICD-10-CM

## 2016-09-06 DIAGNOSIS — N83202 Unspecified ovarian cyst, left side: Secondary | ICD-10-CM | POA: Diagnosis not present

## 2016-09-25 MED FILL — VENLAFAXINE HCL ER 150 MG C: 150 | 30 days supply | Qty: 30 | Fill #6

## 2016-10-08 DIAGNOSIS — N83209 Unspecified ovarian cyst, unspecified side: Secondary | ICD-10-CM | POA: Diagnosis not present

## 2016-10-08 DIAGNOSIS — K5900 Constipation, unspecified: Secondary | ICD-10-CM | POA: Diagnosis not present

## 2016-10-08 DIAGNOSIS — N939 Abnormal uterine and vaginal bleeding, unspecified: Secondary | ICD-10-CM | POA: Diagnosis not present

## 2016-10-08 MED FILL — LINZESS 145 MCG CAPSULE: 145 | 30 days supply | Qty: 30 | Fill #0

## 2016-10-08 MED FILL — LO LOESTRIN FE 1-10 TABLET: 1 MG-10 MCG | 28 days supply | Qty: 28 | Fill #0

## 2016-10-26 MED FILL — VENLAFAXINE HCL ER 150 MG C: 150 | 30 days supply | Qty: 30 | Fill #7

## 2016-11-09 MED FILL — NORG-ETHIN ESTRA 0.25-0.035: 0.25-35 | 84 days supply | Qty: 84 | Fill #0

## 2016-11-22 MED FILL — VENLAFAXINE HCL ER 150 MG C: 150 | 30 days supply | Qty: 30 | Fill #8

## 2016-12-06 DIAGNOSIS — Z01419 Encounter for gynecological examination (general) (routine) without abnormal findings: Secondary | ICD-10-CM | POA: Diagnosis not present

## 2016-12-06 DIAGNOSIS — Z01411 Encounter for gynecological examination (general) (routine) with abnormal findings: Secondary | ICD-10-CM | POA: Diagnosis not present

## 2016-12-06 DIAGNOSIS — Z6827 Body mass index (BMI) 27.0-27.9, adult: Secondary | ICD-10-CM | POA: Diagnosis not present

## 2016-12-06 DIAGNOSIS — K5900 Constipation, unspecified: Secondary | ICD-10-CM | POA: Diagnosis not present

## 2016-12-06 DIAGNOSIS — R635 Abnormal weight gain: Secondary | ICD-10-CM | POA: Diagnosis not present

## 2016-12-07 ENCOUNTER — Other Ambulatory Visit: Payer: Self-pay | Admitting: Obstetrics & Gynecology

## 2016-12-07 DIAGNOSIS — Z1231 Encounter for screening mammogram for malignant neoplasm of breast: Secondary | ICD-10-CM

## 2016-12-13 ENCOUNTER — Ambulatory Visit
Admission: RE | Admit: 2016-12-13 | Discharge: 2016-12-13 | Disposition: A | Discharge status: Home / Self Care | Payer: 59 | Source: Ambulatory Visit | Attending: Obstetrics & Gynecology | Admitting: Obstetrics & Gynecology

## 2016-12-13 DIAGNOSIS — Z1231 Encounter for screening mammogram for malignant neoplasm of breast: Secondary | ICD-10-CM

## 2016-12-17 MED FILL — LINZESS 145 MCG CAPSULE: 145 | 30 days supply | Qty: 30 | Fill #0

## 2016-12-20 MED FILL — VENLAFAXINE HCL ER 150 MG C: 150 | 30 days supply | Qty: 30 | Fill #0

## 2017-01-22 MED FILL — VENLAFAXINE HCL ER 150 MG C: 150 | 30 days supply | Qty: 30 | Fill #1

## 2017-02-05 ENCOUNTER — Encounter: Payer: Self-pay | Admitting: Obstetrics & Gynecology

## 2017-02-18 MED FILL — NORG-ETHIN ESTRA 0.25-0.035: 0.25-35 | 84 days supply | Qty: 84 | Fill #0

## 2017-02-22 MED FILL — VENLAFAXINE HCL ER 150 MG C: 150 | 30 days supply | Qty: 30 | Fill #2

## 2017-03-25 MED FILL — VENLAFAXINE HCL ER 150 MG C: 150 | 30 days supply | Qty: 30 | Fill #3

## 2017-04-24 MED FILL — VENLAFAXINE HCL ER 150 MG C: 150 | 30 days supply | Qty: 30 | Fill #4

## 2017-05-21 MED FILL — VENLAFAXINE HCL ER 150 MG C: 150 | 30 days supply | Qty: 30 | Fill #5

## 2017-05-21 MED FILL — NORG-ETHIN ESTRA 0.25-0.035: 0.25-35 | 84 days supply | Qty: 84 | Fill #1

## 2017-06-21 MED FILL — VENLAFAXINE HCL ER 150 MG C: 150 | 30 days supply | Qty: 30 | Fill #6

## 2017-07-22 MED FILL — VENLAFAXINE HCL ER 150 MG C: 150 | 30 days supply | Qty: 30 | Fill #7

## 2017-08-21 MED FILL — ESTARYLLA 0.25-35 MG-MCG TA: 0.25-35 | 84 days supply | Qty: 84 | Fill #2

## 2017-08-21 MED FILL — VENLAFAXINE HCL ER 150 MG C: 150 | 30 days supply | Qty: 30 | Fill #8

## 2017-09-20 MED FILL — VENLAFAXINE HCL ER 150 MG C: 150 | 30 days supply | Qty: 30 | Fill #9

## 2017-10-21 MED FILL — VENLAFAXINE HCL ER 150 MG C: 150 | 30 days supply | Qty: 30 | Fill #0

## 2017-11-12 MED FILL — ESTARYLLA 0.25-35 MG-MCG TA: 0.25-35 | 84 days supply | Qty: 84 | Fill #3

## 2017-11-15 MED FILL — VENLAFAXINE HCL ER 150 MG C: 150 | 30 days supply | Qty: 30 | Fill #0

## 2017-12-04 ENCOUNTER — Other Ambulatory Visit: Payer: Self-pay | Admitting: Obstetrics & Gynecology

## 2017-12-04 DIAGNOSIS — Z1231 Encounter for screening mammogram for malignant neoplasm of breast: Secondary | ICD-10-CM

## 2017-12-16 MED FILL — VENLAFAXINE HCL ER 150 MG C: 150 | 30 days supply | Qty: 30 | Fill #0

## 2017-12-30 ENCOUNTER — Ambulatory Visit: Payer: 59

## 2018-01-07 ENCOUNTER — Ambulatory Visit
Admission: RE | Admit: 2018-01-07 | Discharge: 2018-01-07 | Disposition: A | Discharge status: Home / Self Care | Payer: 59 | Source: Ambulatory Visit | Attending: Obstetrics & Gynecology | Admitting: Obstetrics & Gynecology

## 2018-01-07 DIAGNOSIS — Z1231 Encounter for screening mammogram for malignant neoplasm of breast: Secondary | ICD-10-CM

## 2018-01-17 MED FILL — VENLAFAXINE HCL ER 150 MG C: 150 | 30 days supply | Qty: 30 | Fill #1

## 2018-01-28 DIAGNOSIS — Z6827 Body mass index (BMI) 27.0-27.9, adult: Secondary | ICD-10-CM | POA: Diagnosis not present

## 2018-01-28 DIAGNOSIS — Z01419 Encounter for gynecological examination (general) (routine) without abnormal findings: Secondary | ICD-10-CM | POA: Diagnosis not present

## 2018-01-28 MED FILL — PREVIFEM 0.25-35 MG-MCG TAB: 0.25-35 | 84 days supply | Qty: 84 | Fill #0

## 2018-02-14 MED FILL — VENLAFAXINE HCL ER 150 MG C: 150 | 30 days supply | Qty: 30 | Fill #0

## 2018-02-26 DIAGNOSIS — H52223 Regular astigmatism, bilateral: Secondary | ICD-10-CM | POA: Diagnosis not present

## 2018-02-26 DIAGNOSIS — H524 Presbyopia: Secondary | ICD-10-CM | POA: Diagnosis not present

## 2018-02-26 DIAGNOSIS — H5213 Myopia, bilateral: Secondary | ICD-10-CM | POA: Diagnosis not present

## 2018-03-11 DIAGNOSIS — Z23 Encounter for immunization: Secondary | ICD-10-CM | POA: Diagnosis not present

## 2018-03-17 MED FILL — VENLAFAXINE HCL ER 150 MG C: 150 | 30 days supply | Qty: 30 | Fill #1

## 2018-04-15 MED FILL — VENLAFAXINE HCL ER 150 MG C: 150 | 30 days supply | Qty: 30 | Fill #2

## 2018-04-29 MED FILL — FEMYNOR 0.25-35 MG-MCG TABS: 0.25-35 | 84 days supply | Qty: 84 | Fill #1

## 2018-05-13 MED FILL — VENLAFAXINE HCL ER 150 MG C: 150 | 30 days supply | Qty: 30 | Fill #3

## 2018-06-06 MED FILL — VENLAFAXINE HCL ER 150 MG C: 150 | 30 days supply | Qty: 30 | Fill #4

## 2018-07-11 MED FILL — VENLAFAXINE HCL ER 150 MG C: 150 | 30 days supply | Qty: 30 | Fill #5

## 2018-07-22 DIAGNOSIS — F329 Major depressive disorder, single episode, unspecified: Secondary | ICD-10-CM | POA: Diagnosis not present

## 2018-07-22 DIAGNOSIS — K5900 Constipation, unspecified: Secondary | ICD-10-CM | POA: Diagnosis not present

## 2018-07-22 DIAGNOSIS — Z0000 Encounter for general adult medical examination without abnormal findings: Secondary | ICD-10-CM | POA: Diagnosis not present

## 2018-07-23 MED FILL — FEMYNOR 0.25-35 MG-MCG TABS: 0.25-35 | 84 days supply | Qty: 84 | Fill #2

## 2018-07-28 MED FILL — VENLAFAXINE HCL ER 75 MG CA: 75 | 30 days supply | Qty: 30 | Fill #0

## 2018-09-03 MED FILL — VENLAFAXINE HCL ER 150 MG C: 150 | 90 days supply | Qty: 45 | Fill #0

## 2018-09-03 MED FILL — LINZESS 72 MCG CAPSULE: 72 | 90 days supply | Qty: 90 | Fill #0

## 2018-09-03 MED FILL — VENLAFAXINE HCL ER 75 MG CA: 75 | 45 days supply | Qty: 45 | Fill #0

## 2018-09-26 MED FILL — FEMYNOR 0.25-35 MG-MCG TABS: 0.25-35 | 84 days supply | Qty: 84 | Fill #3

## 2018-10-27 MED FILL — VENLAFAXINE HCL ER 75 MG CA: 75 | 90 days supply | Qty: 90 | Fill #0

## 2018-12-09 MED FILL — LINZESS 72 MCG CAPSULE: 72 | 90 days supply | Qty: 90 | Fill #0

## 2018-12-19 MED FILL — FEMYNOR 0.25-35 MG-MCG TABS: 0.25-35 | 84 days supply | Qty: 84 | Fill #0

## 2018-12-26 ENCOUNTER — Other Ambulatory Visit: Payer: Self-pay | Admitting: Obstetrics & Gynecology

## 2018-12-26 DIAGNOSIS — Z1231 Encounter for screening mammogram for malignant neoplasm of breast: Secondary | ICD-10-CM

## 2019-01-27 MED FILL — VENLAFAXINE HCL ER 75 MG CA: 75 | 90 days supply | Qty: 90 | Fill #0

## 2019-02-10 ENCOUNTER — Ambulatory Visit: Payer: 59

## 2019-03-09 MED FILL — NORGESTIMATE-ETH ESTRADIOL: 0.25-35 | 84 days supply | Qty: 84 | Fill #0

## 2019-03-18 ENCOUNTER — Telehealth: Payer: 59 | Admitting: Family

## 2019-03-18 ENCOUNTER — Other Ambulatory Visit: Payer: Self-pay

## 2019-03-18 DIAGNOSIS — Z20822 Contact with and (suspected) exposure to covid-19: Secondary | ICD-10-CM

## 2019-03-18 DIAGNOSIS — Z20828 Contact with and (suspected) exposure to other viral communicable diseases: Secondary | ICD-10-CM | POA: Diagnosis not present

## 2019-03-18 NOTE — Progress Notes (Signed)
E-Visit for Corona Virus Screening   Your current symptoms could be consistent with the coronavirus.  Many health care providers can now test patients at their office but not all are.  [facility] has multiple testing sites. For information on our COVID testing locations and hours go to [email or web address]  Please quarantine yourself while awaiting your test results.  We are enrolling you in our [place] for [place] . Daily you will receive a questionnaire within the [place] website. Our COVID 19 response team willl be monitoriing your responses daily.    COVID-19 is a respiratory illness with symptoms that are similar to the flu. Symptoms are typically mild to moderate, but there have been cases of severe illness and death due to the virus. The following symptoms may appear 2-14 days after exposure: . Fever . Cough . Shortness of breath or difficulty breathing . Chills . Repeated shaking with chills . Muscle pain . Headache . Sore throat . New loss of taste or smell . Fatigue . Congestion or runny nose . Nausea or vomiting . Diarrhea  It is vitally important that if you feel that you have an infection such as this virus or any other virus that you stay home and away from places where you may spread it to others.  You should self-quarantine for 14 days if you have symptoms that could potentially be coronavirus or have been in close contact a with a person diagnosed with COVID-19 within the last 2 weeks. You should avoid contact with people age 1 and older.   You should wear a mask or cloth face covering over your nose and mouth if you must be around other people or animals, including pets (even at home). Try to stay at least 6 feet away from other people. This will protect the people around you.    You may also take acetaminophen (Tylenol) as needed for fever.   Reduce your risk of any infection by using the same precautions used for avoiding  the common cold or flu:  Marland Kitchen Wash your hands often with soap and warm water for at least 20 seconds.  If soap and water are not readily available, use an alcohol-based hand sanitizer with at least 60% alcohol.  . If coughing or sneezing, cover your mouth and nose by coughing or sneezing into the elbow areas of your shirt or coat, into a tissue or into your sleeve (not your hands). . Avoid shaking hands with others and consider head nods or verbal greetings only. . Avoid touching your eyes, nose, or mouth with unwashed hands.  . Avoid close contact with people who are sick. . Avoid places or events with large numbers of people in one location, like concerts or sporting events. . Carefully consider travel plans you have or are making. . If you are planning any travel outside or inside the [place], visit the CDC's Travelers' Health webpage for the latest health notices. . If you have some symptoms but not all symptoms, continue to monitor at home and seek medical attention if your symptoms worsen. . If you are having a medical emergency, call 911.  HOME CARE . Only take medications as instructed by your medical team. . Drink plenty of fluids and get plenty of rest. . A steam or ultrasonic humidifier can help if you have congestion.   GET HELP RIGHT AWAY IF YOU HAVE EMERGENCY WARNING SIGNS** FOR COVID-19. If you or someone is showing any of these signs seek emergency medical care  immediately. Call 911 or proceed to your closest emergency facility if: . You develop worsening high fever. . Trouble breathing . Bluish lips or face . Persistent pain or pressure in the chest . New confusion . Inability to wake or stay awake . You cough up blood. . Your symptoms become more severe  **This list is not all possible symptoms. Contact your medical provider for any symptoms that are sever or concerning to you.   MAKE SURE YOU   Understand these instructions.  Will watch your condition.  Will get help  right away if you are not doing well or get worse.   Approximately 5 minutes was spent documenting and reviewing patient's chart.   Your e-visit answers were reviewed by a board certified advanced clinical practitioner to complete your personal care plan.  Depending on the condition, your plan could have included both over the counter or prescription medications.  If there is a problem please reply once you have received a response from your provider.  Your safety is important to [place].  If you have drug allergies check your prescription carefully.    You can use MyChart to ask questions about today's visit, request a non-urgent call back, or ask for a work or school excuse for 24 hours related to this e-Visit. If it has been greater than 24 hours you will need to follow up with your provider, or enter a new e-Visit to address those concerns. You will get an e-mail in the next two days asking about your experience.  I hope that your e-visit has been valuable and will speed your recovery. Thank you for using e-visits.

## 2019-03-19 LAB — NOVEL CORONAVIRUS, NAA: SARS-CoV-2, NAA: NOT DETECTED

## 2019-04-08 DIAGNOSIS — H524 Presbyopia: Secondary | ICD-10-CM | POA: Diagnosis not present

## 2019-04-08 DIAGNOSIS — H5213 Myopia, bilateral: Secondary | ICD-10-CM | POA: Diagnosis not present

## 2019-04-08 DIAGNOSIS — H52223 Regular astigmatism, bilateral: Secondary | ICD-10-CM | POA: Diagnosis not present

## 2019-04-14 MED FILL — VENLAFAXINE HCL ER 75 MG CA: 75 | 90 days supply | Qty: 90 | Fill #0

## 2019-04-15 DIAGNOSIS — E559 Vitamin D deficiency, unspecified: Secondary | ICD-10-CM | POA: Diagnosis not present

## 2019-04-15 DIAGNOSIS — K5900 Constipation, unspecified: Secondary | ICD-10-CM | POA: Diagnosis not present

## 2019-04-15 DIAGNOSIS — F329 Major depressive disorder, single episode, unspecified: Secondary | ICD-10-CM | POA: Diagnosis not present

## 2019-04-15 MED FILL — VENLAFAXINE HCL ER 37.5 MG: 37.5 | 57 days supply | Qty: 90 | Fill #0

## 2019-07-06 MED FILL — VENLAFAXINE HCL ER 75 MG CA: 75 | 30 days supply | Qty: 30 | Fill #0

## 2019-07-06 MED FILL — VENLAFAXINE HCL ER 37.5 MG: 37.5 | 57 days supply | Qty: 90 | Fill #0

## 2019-07-06 MED FILL — NORGESTIMATE-ETH ESTRADIOL: 0.25-35 | 84 days supply | Qty: 84 | Fill #0

## 2019-07-13 ENCOUNTER — Ambulatory Visit: Payer: 59 | Attending: Internal Medicine

## 2019-07-13 DIAGNOSIS — Z20822 Contact with and (suspected) exposure to covid-19: Secondary | ICD-10-CM | POA: Diagnosis not present

## 2019-07-14 LAB — NOVEL CORONAVIRUS, NAA: SARS-CoV-2, NAA: NOT DETECTED

## 2019-08-08 ENCOUNTER — Ambulatory Visit: Payer: 59 | Attending: Internal Medicine

## 2019-08-08 DIAGNOSIS — Z23 Encounter for immunization: Secondary | ICD-10-CM | POA: Insufficient documentation

## 2019-08-08 NOTE — Progress Notes (Signed)
   Covid-19 Vaccination Clinic  Name:  Brianna Hodge    MRN: [id number] DOB: December 26, 1970  08/08/2019  [name] was observed post Covid-19 immunization for 15 minutes without incidence. She was provided with Vaccine Information Sheet and instruction to access the V-Safe system.   [name] was instructed to call 911 with any severe reactions post vaccine: Marland Kitchen Difficulty breathing  . Swelling of your face and throat  . A fast heartbeat  . A bad rash all over your body  . Dizziness and weakness    Immunizations Administered    Name Date Dose VIS Date Route   Pfizer COVID-19 Vaccine 08/08/2019 10:16 AM 0.3 mL 05/22/2019 Intramuscular   Manufacturer: [place]   Lot: [id number]   [place]: [id number]

## 2019-08-29 ENCOUNTER — Ambulatory Visit: Payer: 59 | Attending: Internal Medicine

## 2019-08-29 DIAGNOSIS — Z23 Encounter for immunization: Secondary | ICD-10-CM

## 2019-08-29 NOTE — Progress Notes (Signed)
   Covid-19 Vaccination Clinic  Name:  Brianna Hodge    MRN: [id number] DOB: 21-Oct-1970  08/29/2019  [name] was observed post Covid-19 immunization for 15 minutes without incident. She was provided with Vaccine Information Sheet and instruction to access the V-Safe system.   [name] was instructed to call 911 with any severe reactions post vaccine: Marland Kitchen Difficulty breathing  . Swelling of face and throat  . A fast heartbeat  . A bad rash all over body  . Dizziness and weakness   Immunizations Administered    Name Date Dose VIS Date Route   Pfizer COVID-19 Vaccine 08/29/2019 11:11 AM 0.3 mL 05/22/2019 Intramuscular   Manufacturer: [place]   Lot: [id number]   [place]: [id number]

## 2019-09-02 ENCOUNTER — Ambulatory Visit: Payer: 59

## 2019-09-03 MED FILL — VENLAFAXINE HCL ER 75 MG CA: 75 | 30 days supply | Qty: 30 | Fill #1

## 2019-09-30 MED FILL — VENLAFAXINE HCL ER 75 MG CA: 75 | 30 days supply | Qty: 30 | Fill #2

## 2019-10-09 MED FILL — VYLIBRA 0.25-35 MG-MCG TABS: 0.25-35 | 84 days supply | Qty: 84 | Fill #0

## 2019-10-30 MED FILL — VENLAFAXINE HCL ER 75 MG CA: 75 | 30 days supply | Qty: 30 | Fill #0

## 2019-11-18 ENCOUNTER — Other Ambulatory Visit: Payer: Self-pay

## 2019-11-18 ENCOUNTER — Ambulatory Visit
Admission: RE | Admit: 2019-11-18 | Discharge: 2019-11-18 | Disposition: A | Discharge status: Home / Self Care | Payer: 59 | Source: Ambulatory Visit | Attending: Obstetrics & Gynecology | Admitting: Obstetrics & Gynecology

## 2019-11-18 DIAGNOSIS — Z1231 Encounter for screening mammogram for malignant neoplasm of breast: Secondary | ICD-10-CM

## 2019-12-03 MED FILL — VENLAFAXINE HCL ER 37.5 MG: 37.5 | 57 days supply | Qty: 90 | Fill #0

## 2020-01-15 MED FILL — VYLIBRA 0.25-35 MG-MCG TABS: 0.25-35 | 84 days supply | Qty: 84 | Fill #0

## 2020-01-18 ENCOUNTER — Other Ambulatory Visit (HOSPITAL_COMMUNITY): Payer: Self-pay | Admitting: Obstetrics & Gynecology

## 2020-01-18 DIAGNOSIS — Z6827 Body mass index (BMI) 27.0-27.9, adult: Secondary | ICD-10-CM | POA: Diagnosis not present

## 2020-01-18 DIAGNOSIS — Z01419 Encounter for gynecological examination (general) (routine) without abnormal findings: Secondary | ICD-10-CM | POA: Diagnosis not present

## 2020-01-18 MED FILL — VENLAFAXINE HCL ER 75 MG CA: 75 | 90 days supply | Qty: 90 | Fill #0

## 2020-02-02 MED FILL — VENLAFAXINE HCL ER 75 MG CA: 75 | 90 days supply | Qty: 90 | Fill #0

## 2020-04-05 MED FILL — VYLIBRA 0.25-35 MG-MCG TABS: 0.25-35 | 84 days supply | Qty: 84 | Fill #0

## 2020-04-19 ENCOUNTER — Other Ambulatory Visit (HOSPITAL_COMMUNITY): Payer: Self-pay | Admitting: Obstetrics & Gynecology

## 2020-04-19 MED FILL — VENLAFAXINE HCL ER 37.5 MG: 37.5 | 30 days supply | Qty: 30 | Fill #0

## 2020-05-16 MED FILL — VENLAFAXINE HCL ER 37.5 MG: 37.5 | 30 days supply | Qty: 30 | Fill #1

## 2020-06-09 MED FILL — VENLAFAXINE HCL ER 37.5 MG: 37.5 | 30 days supply | Qty: 30 | Fill #2

## 2020-07-08 MED FILL — VENLAFAXINE HCL ER 37.5 MG: 37.5 | 30 days supply | Qty: 30 | Fill #3

## 2020-07-08 MED FILL — VYLIBRA 0.25-35 MG-MCG TABS: 0.25-35 | 84 days supply | Qty: 84 | Fill #1

## 2020-08-04 DIAGNOSIS — H524 Presbyopia: Secondary | ICD-10-CM | POA: Diagnosis not present

## 2020-08-04 MED FILL — VENLAFAXINE HCL ER 37.5 MG: 37.5 | 30 days supply | Qty: 30 | Fill #4

## 2020-09-03 MED FILL — VENLAFAXINE HCL ER 37.5 MG: 37.5 | 30 days supply | Qty: 30 | Fill #5

## 2020-09-19 ENCOUNTER — Other Ambulatory Visit (HOSPITAL_COMMUNITY): Payer: Self-pay

## 2020-09-19 MED FILL — Norgestimate & Ethinyl Estradiol Tab 0.25 MG-35 MCG: ORAL | 84 days supply | Qty: 84 | Fill #0 | Status: AC

## 2020-10-01 ENCOUNTER — Other Ambulatory Visit (HOSPITAL_COMMUNITY): Payer: Self-pay

## 2020-10-01 MED FILL — Venlafaxine HCl Cap ER 24HR 37.5 MG (Base Equivalent): ORAL | 30 days supply | Qty: 30 | Fill #0 | Status: AC

## 2020-10-28 ENCOUNTER — Other Ambulatory Visit (HOSPITAL_COMMUNITY): Payer: Self-pay

## 2020-10-28 MED FILL — Venlafaxine HCl Cap ER 24HR 37.5 MG (Base Equivalent): ORAL | 30 days supply | Qty: 30 | Fill #1 | Status: AC

## 2020-10-29 ENCOUNTER — Other Ambulatory Visit (HOSPITAL_COMMUNITY): Payer: Self-pay

## 2020-11-17 ENCOUNTER — Other Ambulatory Visit (HOSPITAL_COMMUNITY): Payer: Self-pay

## 2020-11-17 ENCOUNTER — Telehealth: Payer: 59 | Admitting: Physician Assistant

## 2020-11-17 DIAGNOSIS — J019 Acute sinusitis, unspecified: Secondary | ICD-10-CM | POA: Diagnosis not present

## 2020-11-17 DIAGNOSIS — B9689 Other specified bacterial agents as the cause of diseases classified elsewhere: Secondary | ICD-10-CM

## 2020-11-17 MED ORDER — DOXYCYCLINE HYCLATE 100 MG PO TABS
100.0000 mg | ORAL_TABLET | Freq: Two times a day (BID) | ORAL | 0 refills | Status: DC
  Filled 2020-11-17: qty 20, 10d supply, fill #0

## 2020-11-17 MED ORDER — AZITHROMYCIN 250 MG PO TABS
ORAL_TABLET | ORAL | 0 refills | Status: AC
  Filled 2020-11-17: qty 6, 5d supply, fill #0

## 2020-11-17 NOTE — Progress Notes (Signed)
I have spent 5 minutes in review of e-visit questionnaire, review and updating patient chart, medical decision making and response to patient.   Cole Klugh Cody Kaleen Rochette, PA-C    

## 2020-11-17 NOTE — Addendum Note (Signed)
Addended by: Brunetta Jeans on: 11/17/2020 03:26 PM   Modules accepted: Orders

## 2020-11-17 NOTE — Progress Notes (Signed)

## 2020-11-18 ENCOUNTER — Telehealth: Payer: 59 | Admitting: Physician Assistant

## 2020-11-18 ENCOUNTER — Other Ambulatory Visit (HOSPITAL_COMMUNITY): Payer: Self-pay

## 2020-11-18 DIAGNOSIS — F411 Generalized anxiety disorder: Secondary | ICD-10-CM

## 2020-11-18 MED ORDER — HYDROXYZINE HCL 10 MG PO TABS
10.0000 mg | ORAL_TABLET | Freq: Three times a day (TID) | ORAL | 0 refills | Status: AC | PRN
  Filled 2020-11-18: qty 30, 10d supply, fill #0

## 2020-11-18 NOTE — Patient Instructions (Signed)
  [facility], thank you for joining Leeanne Rio, PA-C for today's virtual visit.  While this provider is not your primary care provider (PCP), if your PCP is located in our provider database this encounter information will be shared with them immediately following your visit.  Consent: (Patient) Brianna Hodge [facility] provided verbal consent for this virtual visit at the beginning of the encounter.  Current Medications:  Current Outpatient Medications:    hydrOXYzine (ATARAX/VISTARIL) 10 MG tablet, Take 1 tablet (10 mg total) by mouth 3 (three) times daily as needed., Disp: 30 tablet, Rfl: 0   azithromycin (ZITHROMAX) 250 MG tablet, Take 2 tablets on day 1, then 1 tablet daily on days 2 through 5, Disp: 6 tablet, Rfl: 0   cetirizine (ZYRTEC) 10 MG tablet, Take 10 mg by mouth at bedtime., Disp: , Rfl:    norgestimate-ethinyl estradiol (ORTHO-CYCLEN) 0.25-35 MG-MCG tablet, TAKE 1 TABLET BY MOUTH DAILY, Disp: 84 tablet, Rfl: 3   venlafaxine XR (EFFEXOR-XR) 37.5 MG 24 hr capsule, TAKE 1 CAPSULE BY MOUTH DAILY, Disp: 30 capsule, Rfl: 11   Medications ordered in this encounter:  Meds ordered this encounter  Medications   hydrOXYzine (ATARAX/VISTARIL) 10 MG tablet    Sig: Take 1 tablet (10 mg total) by mouth 3 (three) times daily as needed.    Dispense:  30 tablet    Refill:  0    Order Specific Question:   Supervising Provider    Answer:   Sabra Heck, BRIAN [[id number]]     *If you need refills on other medications prior to your next appointment, please contact your pharmacy*  Follow-Up: Call back or seek an in-person evaluation if the symptoms worsen or if the condition fails to improve as anticipated.  Other Instructions Please continue your daily dose of the Effexor XR.  Use the Hydroxyzine as directed if needed for more acute anxiety. I recommend downloading either the Calm or HeadSpace app on your phone to work on stress relief tactics. This can be very beneficial long-term  for anxiety.  Consider getting a book on Mindfulness training as well.   For Counseling I recommend [facility]. You can call them at [phone] to set up a counseling appointment.   Please follow-up with your PCP as scheduled for further management.   Take care and have a safe and fun trip!   If you have been instructed to have an in-person evaluation today at a local Urgent Care facility, please use the link below. It will take you to a list of all of our available [facility], including address, phone number and hours of operation. Please do not delay care.  [facility]  If you or a family member do not have a primary care provider, use the link below to schedule a visit and establish care. When you choose a [facility] primary care physician or advanced practice provider, you gain a long-term partner in health. Find a Primary Care Provider  Learn more about [place]'s in-office and virtual care options: [facility]

## 2020-11-18 NOTE — Progress Notes (Signed)
Virtual Visit Consent   [facility], you are scheduled for a virtual visit with a [facility] provider today.     Just as with appointments in the office, your consent must be obtained to participate.  Your consent will be active for this visit and any virtual visit you may have with one of our providers in the next 365 days.     If you have a MyChart account, a copy of this consent can be sent to you electronically.  All virtual visits are billed to your insurance company just like a traditional visit in the office.    As this is a virtual visit, video technology does not allow for your provider to perform a traditional examination.  This may limit your provider's ability to fully assess your condition.  If your provider identifies any concerns that need to be evaluated in person or the need to arrange testing (such as labs, EKG, etc.), we will make arrangements to do so.     Although advances in technology are sophisticated, we cannot ensure that it will always work on either your end or our end.  If the connection with a video visit is poor, the visit may have to be switched to a telephone visit.  With either a video or telephone visit, we are not always able to ensure that we have a secure connection.     I need to obtain your verbal consent now.   Are you willing to proceed with your visit today?    Brianna Hodge has provided verbal consent on 11/18/2020 for a virtual visit (video or telephone).   Leeanne Rio, [place]   Date: 11/18/2020 12:16 PM   Virtual Visit via Video Note   ILeeanne Rio, connected [email or web address] ([id number], Apr 02, 1971) on 11/18/20 at 12:30 PM EDT by a video-enabled telemedicine application and verified that I am speaking with the correct person using two identifiers.  Location: Patient: Virtual Visit Location Patient: Home Provider: Virtual Visit Location Provider: Home Office   I discussed the limitations of evaluation and management  by telemedicine and the availability of in person appointments. The patient expressed understanding and agreed to proceed.    History of Present Illness: Brianna Hodge is a 50 y.o. who identifies as a female who was assigned female at birth, and is being seen today to discuss increase in anxiety levels since COVID illness last week. Patient and family members with mild-moderate COVID symptoms. Patient thankfully has recovered well from her significant symptoms -- fever, chills, aches, etc. Is currently being treated for a secondary sinusitis and doing well with treatment thus far. Notes she has just felt more anxious since last week and is worrying more. Notes increase in generalized anxiety with episodes of more acute anxiety. No true panic attack. Is prescribed Effexor XR 37.5 mg daily by her PCP. Is taking as directed but was not sure if how she is feeling would be short-lived or if adjustments in her chronic regimen were needed. Denies any true depressed mood or anhedonia. No SI.   Problems: There are no problems to display for this patient.   Allergies:  Allergies  Allergen Reactions   Amoxicillin Rash   Medications:  Current Outpatient Medications:    azithromycin (ZITHROMAX) 250 MG tablet, Take 2 tablets on day 1, then 1 tablet daily on days 2 through 5, Disp: 6 tablet, Rfl: 0   cetirizine (ZYRTEC) 10 MG tablet, Take 10 mg by mouth at bedtime., Disp: ,  Rfl:    HYDROcodone-acetaminophen (NORCO) 5-325 MG tablet, Take 1-2 tablets by mouth every 6 (six) hours as needed., Disp: 30 tablet, Rfl: 0   norethindrone-ethinyl estradiol (JUNEL FE,GILDESS FE,LOESTRIN FE) 1-20 MG-MCG tablet, Take 1 tablet by mouth daily., Disp: , Rfl:    norgestimate-ethinyl estradiol (ORTHO-CYCLEN) 0.25-35 MG-MCG tablet, TAKE 1 TABLET BY MOUTH DAILY, Disp: 84 tablet, Rfl: 3   Venlafaxine HCl (EFFEXOR PO), Take 150 mg by mouth at bedtime as needed and may repeat dose one time if needed., Disp: , Rfl:     venlafaxine XR (EFFEXOR-XR) 37.5 MG 24 hr capsule, TAKE 1 CAPSULE BY MOUTH DAILY, Disp: 30 capsule, Rfl: 11  Observations/Objective: Patient is well-developed, well-nourished in no acute distress.  Resting comfortably on porch at home.  Head is normocephalic, atraumatic.  No labored breathing. Speech is clear and coherent with logical content.  Patient is alert and oriented at baseline.   Assessment and Plan: 1. Anxiety state Reaction to recent COVID infection. She is recovering well from that and her secondary sinusitis. Increase in generalized anxiety with more acute anxiety. Reviewed mindfulness training and counseling with patient. Resources given. Will start Rx Hydroxyzine 10 mg up to TID PRN for more acute anxiety until she can get in with her PCP next Wed for follow-up to discuss if they should consider adjusting her SNRI.    Follow Up Instructions: I discussed the assessment and treatment plan with the patient. The patient was provided an opportunity to ask questions and all were answered. The patient agreed with the plan and demonstrated an understanding of the instructions.  A copy of instructions were sent to the patient via MyChart.  The patient was advised to call back or seek an in-person evaluation if the symptoms worsen or if the condition fails to improve as anticipated.  Time:  I spent 10 minutes with the patient via telehealth technology discussing the above problems/concerns.    Leeanne Rio, PA-C

## 2020-11-21 DIAGNOSIS — E02 Subclinical iodine-deficiency hypothyroidism: Secondary | ICD-10-CM | POA: Diagnosis not present

## 2020-11-21 DIAGNOSIS — E559 Vitamin D deficiency, unspecified: Secondary | ICD-10-CM | POA: Diagnosis not present

## 2020-11-21 DIAGNOSIS — F411 Generalized anxiety disorder: Secondary | ICD-10-CM | POA: Diagnosis not present

## 2020-11-21 DIAGNOSIS — U099 Post covid-19 condition, unspecified: Secondary | ICD-10-CM | POA: Diagnosis not present

## 2020-11-21 DIAGNOSIS — Z0001 Encounter for general adult medical examination with abnormal findings: Secondary | ICD-10-CM | POA: Diagnosis not present

## 2020-11-22 ENCOUNTER — Other Ambulatory Visit (HOSPITAL_COMMUNITY): Payer: Self-pay

## 2020-11-22 MED ORDER — VENLAFAXINE HCL ER 75 MG PO CP24
ORAL_CAPSULE | ORAL | 0 refills | Status: DC
  Filled 2020-11-22: qty 90, 90d supply, fill #0

## 2020-11-22 MED ORDER — CLONAZEPAM 0.5 MG PO TABS
ORAL_TABLET | ORAL | 0 refills | Status: AC
  Filled 2020-11-22: qty 30, 30d supply, fill #0

## 2020-11-23 ENCOUNTER — Other Ambulatory Visit (HOSPITAL_COMMUNITY): Payer: Self-pay

## 2020-11-28 DIAGNOSIS — F411 Generalized anxiety disorder: Secondary | ICD-10-CM | POA: Diagnosis not present

## 2020-11-28 DIAGNOSIS — F410 Panic disorder [episodic paroxysmal anxiety] without agoraphobia: Secondary | ICD-10-CM | POA: Diagnosis not present

## 2020-12-10 ENCOUNTER — Other Ambulatory Visit (HOSPITAL_COMMUNITY): Payer: Self-pay

## 2020-12-10 MED FILL — Norgestimate & Ethinyl Estradiol Tab 0.25 MG-35 MCG: ORAL | 84 days supply | Qty: 84 | Fill #1 | Status: AC

## 2020-12-13 DIAGNOSIS — F32A Depression, unspecified: Secondary | ICD-10-CM | POA: Diagnosis not present

## 2020-12-13 DIAGNOSIS — F411 Generalized anxiety disorder: Secondary | ICD-10-CM | POA: Diagnosis not present

## 2020-12-13 DIAGNOSIS — F410 Panic disorder [episodic paroxysmal anxiety] without agoraphobia: Secondary | ICD-10-CM | POA: Diagnosis not present

## 2021-02-10 ENCOUNTER — Other Ambulatory Visit (HOSPITAL_COMMUNITY): Payer: Self-pay

## 2021-02-10 MED ORDER — NORGESTIMATE-ETH ESTRADIOL 0.25-35 MG-MCG PO TABS
1.0000 | ORAL_TABLET | Freq: Every day | ORAL | 3 refills | Status: AC
  Filled 2021-02-10: qty 84, 84d supply, fill #0
  Filled 2021-07-27: qty 84, 84d supply, fill #1

## 2021-02-10 MED FILL — Venlafaxine HCl Cap ER 24HR 37.5 MG (Base Equivalent): ORAL | 30 days supply | Qty: 30 | Fill #2 | Status: AC

## 2021-02-14 ENCOUNTER — Other Ambulatory Visit (HOSPITAL_COMMUNITY): Payer: Self-pay

## 2021-02-14 DIAGNOSIS — F411 Generalized anxiety disorder: Secondary | ICD-10-CM | POA: Diagnosis not present

## 2021-02-14 DIAGNOSIS — F32A Depression, unspecified: Secondary | ICD-10-CM | POA: Diagnosis not present

## 2021-02-14 DIAGNOSIS — F410 Panic disorder [episodic paroxysmal anxiety] without agoraphobia: Secondary | ICD-10-CM | POA: Diagnosis not present

## 2021-02-14 MED ORDER — VENLAFAXINE HCL ER 75 MG PO CP24
ORAL_CAPSULE | ORAL | 1 refills | Status: DC
  Filled 2021-02-14: qty 30, 30d supply, fill #0
  Filled 2021-03-17: qty 30, 30d supply, fill #1

## 2021-02-17 ENCOUNTER — Other Ambulatory Visit (HOSPITAL_COMMUNITY): Payer: Self-pay

## 2021-02-18 ENCOUNTER — Other Ambulatory Visit (HOSPITAL_COMMUNITY): Payer: Self-pay

## 2021-02-20 ENCOUNTER — Other Ambulatory Visit (HOSPITAL_COMMUNITY): Payer: Self-pay

## 2021-02-23 DIAGNOSIS — U099 Post covid-19 condition, unspecified: Secondary | ICD-10-CM | POA: Diagnosis not present

## 2021-02-23 DIAGNOSIS — F411 Generalized anxiety disorder: Secondary | ICD-10-CM | POA: Diagnosis not present

## 2021-02-23 DIAGNOSIS — E02 Subclinical iodine-deficiency hypothyroidism: Secondary | ICD-10-CM | POA: Diagnosis not present

## 2021-02-23 DIAGNOSIS — R7301 Impaired fasting glucose: Secondary | ICD-10-CM | POA: Diagnosis not present

## 2021-02-23 DIAGNOSIS — N959 Unspecified menopausal and perimenopausal disorder: Secondary | ICD-10-CM | POA: Diagnosis not present

## 2021-02-23 DIAGNOSIS — R5383 Other fatigue: Secondary | ICD-10-CM | POA: Diagnosis not present

## 2021-03-06 ENCOUNTER — Other Ambulatory Visit (HOSPITAL_COMMUNITY): Payer: Self-pay

## 2021-03-06 DIAGNOSIS — F329 Major depressive disorder, single episode, unspecified: Secondary | ICD-10-CM | POA: Diagnosis not present

## 2021-03-06 DIAGNOSIS — E02 Subclinical iodine-deficiency hypothyroidism: Secondary | ICD-10-CM | POA: Diagnosis not present

## 2021-03-06 DIAGNOSIS — Z6827 Body mass index (BMI) 27.0-27.9, adult: Secondary | ICD-10-CM | POA: Diagnosis not present

## 2021-03-06 MED ORDER — METFORMIN HCL ER 500 MG PO TB24
ORAL_TABLET | ORAL | 0 refills | Status: AC
  Filled 2021-03-06: qty 90, 90d supply, fill #0

## 2021-03-09 DIAGNOSIS — Z23 Encounter for immunization: Secondary | ICD-10-CM | POA: Diagnosis not present

## 2021-03-09 DIAGNOSIS — L821 Other seborrheic keratosis: Secondary | ICD-10-CM | POA: Diagnosis not present

## 2021-03-09 DIAGNOSIS — L814 Other melanin hyperpigmentation: Secondary | ICD-10-CM | POA: Diagnosis not present

## 2021-03-17 ENCOUNTER — Other Ambulatory Visit (HOSPITAL_COMMUNITY): Payer: Self-pay

## 2021-04-11 DIAGNOSIS — Z01419 Encounter for gynecological examination (general) (routine) without abnormal findings: Secondary | ICD-10-CM | POA: Diagnosis not present

## 2021-04-11 DIAGNOSIS — Z6827 Body mass index (BMI) 27.0-27.9, adult: Secondary | ICD-10-CM | POA: Diagnosis not present

## 2021-04-11 DIAGNOSIS — Z3040 Encounter for surveillance of contraceptives, unspecified: Secondary | ICD-10-CM | POA: Diagnosis not present

## 2021-04-12 ENCOUNTER — Other Ambulatory Visit (HOSPITAL_COMMUNITY): Payer: Self-pay

## 2021-04-12 DIAGNOSIS — Z01419 Encounter for gynecological examination (general) (routine) without abnormal findings: Secondary | ICD-10-CM | POA: Diagnosis not present

## 2021-04-12 MED ORDER — NORGESTIMATE-ETH ESTRADIOL 0.25-35 MG-MCG PO TABS
1.0000 | ORAL_TABLET | Freq: Every day | ORAL | 3 refills | Status: DC
  Filled 2021-04-12 – 2021-05-17 (×2): qty 84, 84d supply, fill #0
  Filled 2021-07-27: qty 84, 84d supply, fill #1
  Filled 2021-10-18: qty 28, 28d supply, fill #2
  Filled 2021-11-23: qty 28, 28d supply, fill #3
  Filled 2022-01-18: qty 28, 28d supply, fill #4
  Filled 2022-02-09: qty 28, 28d supply, fill #5

## 2021-04-20 ENCOUNTER — Other Ambulatory Visit (HOSPITAL_COMMUNITY): Payer: Self-pay

## 2021-04-20 DIAGNOSIS — F411 Generalized anxiety disorder: Secondary | ICD-10-CM | POA: Diagnosis not present

## 2021-04-20 DIAGNOSIS — Z6827 Body mass index (BMI) 27.0-27.9, adult: Secondary | ICD-10-CM | POA: Diagnosis not present

## 2021-04-20 DIAGNOSIS — F329 Major depressive disorder, single episode, unspecified: Secondary | ICD-10-CM | POA: Diagnosis not present

## 2021-04-20 DIAGNOSIS — E02 Subclinical iodine-deficiency hypothyroidism: Secondary | ICD-10-CM | POA: Diagnosis not present

## 2021-04-20 MED ORDER — MOUNJARO 2.5 MG/0.5ML ~~LOC~~ SOAJ
SUBCUTANEOUS | 0 refills | Status: DC
  Filled 2021-04-20: qty 2, 28d supply, fill #0

## 2021-04-20 MED ORDER — VENLAFAXINE HCL ER 75 MG PO CP24
ORAL_CAPSULE | ORAL | 1 refills | Status: DC
  Filled 2021-04-20: qty 30, 30d supply, fill #0
  Filled 2021-05-17: qty 30, 30d supply, fill #1

## 2021-05-17 ENCOUNTER — Other Ambulatory Visit (HOSPITAL_COMMUNITY): Payer: Self-pay

## 2021-05-17 MED ORDER — TIRZEPATIDE 2.5 MG/0.5ML ~~LOC~~ SOAJ
SUBCUTANEOUS | 0 refills | Status: AC
  Filled 2021-05-17: qty 2, 28d supply, fill #0

## 2021-05-20 ENCOUNTER — Other Ambulatory Visit (HOSPITAL_COMMUNITY): Payer: Self-pay

## 2021-05-20 MED ORDER — CARESTART COVID-19 HOME TEST VI KIT
PACK | 0 refills | Status: AC
  Filled 2021-05-20: qty 4, 4d supply, fill #0

## 2021-06-02 ENCOUNTER — Other Ambulatory Visit (HOSPITAL_COMMUNITY): Payer: Self-pay

## 2021-06-02 MED ORDER — MOUNJARO 5 MG/0.5ML ~~LOC~~ SOAJ
SUBCUTANEOUS | 0 refills | Status: DC
  Filled 2021-06-02: qty 2, 28d supply, fill #0

## 2021-06-16 ENCOUNTER — Other Ambulatory Visit (HOSPITAL_COMMUNITY): Payer: Self-pay

## 2021-06-16 MED ORDER — VENLAFAXINE HCL ER 75 MG PO CP24
ORAL_CAPSULE | ORAL | 0 refills | Status: DC
  Filled 2021-06-16: qty 30, 30d supply, fill #0

## 2021-06-30 ENCOUNTER — Other Ambulatory Visit (HOSPITAL_COMMUNITY): Payer: Self-pay

## 2021-06-30 MED ORDER — TIRZEPATIDE 5 MG/0.5ML ~~LOC~~ SOAJ
SUBCUTANEOUS | 0 refills | Status: AC
  Filled 2021-06-30 – 2021-07-27 (×2): qty 2, 28d supply, fill #0

## 2021-06-30 MED ORDER — MOUNJARO 5 MG/0.5ML ~~LOC~~ SOAJ
SUBCUTANEOUS | 0 refills | Status: AC
  Filled 2021-06-30: qty 2, 28d supply, fill #0

## 2021-07-05 ENCOUNTER — Other Ambulatory Visit (HOSPITAL_COMMUNITY): Payer: Self-pay

## 2021-07-05 DIAGNOSIS — E02 Subclinical iodine-deficiency hypothyroidism: Secondary | ICD-10-CM | POA: Diagnosis not present

## 2021-07-05 DIAGNOSIS — E559 Vitamin D deficiency, unspecified: Secondary | ICD-10-CM | POA: Diagnosis not present

## 2021-07-05 DIAGNOSIS — F411 Generalized anxiety disorder: Secondary | ICD-10-CM | POA: Diagnosis not present

## 2021-07-05 DIAGNOSIS — K5900 Constipation, unspecified: Secondary | ICD-10-CM | POA: Diagnosis not present

## 2021-07-05 MED ORDER — MOUNJARO 5 MG/0.5ML ~~LOC~~ SOAJ
5.0000 mg | SUBCUTANEOUS | 0 refills | Status: AC
  Filled 2021-07-05 – 2021-08-26 (×2): qty 2, 28d supply, fill #0

## 2021-07-06 DIAGNOSIS — Z0001 Encounter for general adult medical examination with abnormal findings: Secondary | ICD-10-CM | POA: Diagnosis not present

## 2021-07-06 DIAGNOSIS — E559 Vitamin D deficiency, unspecified: Secondary | ICD-10-CM | POA: Diagnosis not present

## 2021-07-11 ENCOUNTER — Other Ambulatory Visit (HOSPITAL_COMMUNITY): Payer: Self-pay

## 2021-07-19 ENCOUNTER — Other Ambulatory Visit (HOSPITAL_COMMUNITY): Payer: Self-pay

## 2021-07-19 MED ORDER — VENLAFAXINE HCL ER 75 MG PO CP24
ORAL_CAPSULE | ORAL | 0 refills | Status: DC
  Filled 2021-07-19: qty 30, 30d supply, fill #0

## 2021-07-27 ENCOUNTER — Other Ambulatory Visit (HOSPITAL_COMMUNITY): Payer: Self-pay

## 2021-07-28 ENCOUNTER — Other Ambulatory Visit (HOSPITAL_COMMUNITY): Payer: Self-pay

## 2021-07-31 ENCOUNTER — Other Ambulatory Visit (HOSPITAL_COMMUNITY): Payer: Self-pay

## 2021-08-11 ENCOUNTER — Other Ambulatory Visit (HOSPITAL_COMMUNITY): Payer: Self-pay

## 2021-08-12 ENCOUNTER — Other Ambulatory Visit (HOSPITAL_COMMUNITY): Payer: Self-pay

## 2021-08-14 ENCOUNTER — Other Ambulatory Visit (HOSPITAL_COMMUNITY): Payer: Self-pay

## 2021-08-14 MED ORDER — VENLAFAXINE HCL ER 75 MG PO CP24
ORAL_CAPSULE | ORAL | 0 refills | Status: DC
  Filled 2021-08-14: qty 90, 90d supply, fill #0

## 2021-08-26 ENCOUNTER — Other Ambulatory Visit (HOSPITAL_COMMUNITY): Payer: Self-pay

## 2021-08-30 DIAGNOSIS — F411 Generalized anxiety disorder: Secondary | ICD-10-CM | POA: Diagnosis not present

## 2021-08-30 DIAGNOSIS — E02 Subclinical iodine-deficiency hypothyroidism: Secondary | ICD-10-CM | POA: Diagnosis not present

## 2021-08-30 DIAGNOSIS — F329 Major depressive disorder, single episode, unspecified: Secondary | ICD-10-CM | POA: Diagnosis not present

## 2021-08-30 DIAGNOSIS — E559 Vitamin D deficiency, unspecified: Secondary | ICD-10-CM | POA: Diagnosis not present

## 2021-09-08 ENCOUNTER — Other Ambulatory Visit: Payer: Self-pay | Admitting: Obstetrics & Gynecology

## 2021-09-08 DIAGNOSIS — Z1231 Encounter for screening mammogram for malignant neoplasm of breast: Secondary | ICD-10-CM

## 2021-09-18 ENCOUNTER — Ambulatory Visit
Admission: RE | Admit: 2021-09-18 | Discharge: 2021-09-18 | Disposition: A | Discharge status: Home / Self Care | Payer: 59 | Source: Ambulatory Visit | Attending: Obstetrics & Gynecology | Admitting: Obstetrics & Gynecology

## 2021-09-18 DIAGNOSIS — Z1231 Encounter for screening mammogram for malignant neoplasm of breast: Secondary | ICD-10-CM

## 2021-10-18 ENCOUNTER — Other Ambulatory Visit (HOSPITAL_COMMUNITY): Payer: Self-pay

## 2021-10-24 ENCOUNTER — Other Ambulatory Visit (HOSPITAL_COMMUNITY): Payer: Self-pay

## 2021-11-11 ENCOUNTER — Other Ambulatory Visit (HOSPITAL_COMMUNITY): Payer: Self-pay

## 2021-11-11 MED ORDER — VENLAFAXINE HCL ER 75 MG PO CP24
ORAL_CAPSULE | ORAL | 0 refills | Status: AC
  Filled 2021-11-11: qty 30, 30d supply, fill #0

## 2021-11-15 ENCOUNTER — Other Ambulatory Visit (HOSPITAL_COMMUNITY): Payer: Self-pay

## 2021-11-23 ENCOUNTER — Other Ambulatory Visit (HOSPITAL_COMMUNITY): Payer: Self-pay

## 2021-11-29 ENCOUNTER — Other Ambulatory Visit (HOSPITAL_COMMUNITY): Payer: Self-pay

## 2021-11-30 ENCOUNTER — Other Ambulatory Visit (HOSPITAL_COMMUNITY): Payer: Self-pay

## 2021-11-30 MED ORDER — WEGOVY 0.25 MG/0.5ML ~~LOC~~ SOAJ
SUBCUTANEOUS | 0 refills | Status: AC
Start: 2021-11-30 — End: ?
  Filled 2021-11-30 – 2021-12-11 (×2): qty 2, 28d supply, fill #0

## 2021-12-05 ENCOUNTER — Other Ambulatory Visit (HOSPITAL_COMMUNITY): Payer: Self-pay

## 2021-12-07 ENCOUNTER — Other Ambulatory Visit (HOSPITAL_COMMUNITY): Payer: Self-pay

## 2021-12-07 MED ORDER — METFORMIN HCL ER 500 MG PO TB24
ORAL_TABLET | ORAL | 0 refills | Status: AC
  Filled 2021-12-07: qty 30, 30d supply, fill #0

## 2021-12-11 ENCOUNTER — Other Ambulatory Visit (HOSPITAL_COMMUNITY): Payer: Self-pay

## 2021-12-13 ENCOUNTER — Other Ambulatory Visit (HOSPITAL_COMMUNITY): Payer: Self-pay

## 2022-01-14 ENCOUNTER — Other Ambulatory Visit (HOSPITAL_COMMUNITY): Payer: Self-pay

## 2022-01-14 ENCOUNTER — Telehealth: Payer: Self-pay | Admitting: Nurse Practitioner

## 2022-01-14 DIAGNOSIS — J019 Acute sinusitis, unspecified: Secondary | ICD-10-CM

## 2022-01-14 DIAGNOSIS — B9689 Other specified bacterial agents as the cause of diseases classified elsewhere: Secondary | ICD-10-CM

## 2022-01-14 MED ORDER — DOXYCYCLINE HYCLATE 100 MG PO TABS
100.0000 mg | ORAL_TABLET | Freq: Two times a day (BID) | ORAL | 0 refills | Status: AC
  Filled 2022-01-14: qty 20, 10d supply, fill #0

## 2022-01-14 NOTE — Progress Notes (Signed)
I have spent 5 minutes in review of e-visit questionnaire, review and updating patient chart, medical decision making and response to patient.  ° °Geoff Dacanay W Mae Denunzio, NP ° °  °

## 2022-01-14 NOTE — Progress Notes (Signed)

## 2022-01-15 ENCOUNTER — Other Ambulatory Visit (HOSPITAL_COMMUNITY): Payer: Self-pay

## 2022-01-16 ENCOUNTER — Other Ambulatory Visit (HOSPITAL_COMMUNITY): Payer: Self-pay

## 2022-01-18 ENCOUNTER — Other Ambulatory Visit (HOSPITAL_COMMUNITY): Payer: Self-pay

## 2022-01-19 ENCOUNTER — Other Ambulatory Visit (HOSPITAL_COMMUNITY): Payer: Self-pay

## 2022-02-09 ENCOUNTER — Other Ambulatory Visit (HOSPITAL_COMMUNITY): Payer: Self-pay

## 2022-02-13 ENCOUNTER — Other Ambulatory Visit (HOSPITAL_COMMUNITY): Payer: Self-pay

## 2022-02-15 ENCOUNTER — Other Ambulatory Visit (HOSPITAL_COMMUNITY): Payer: Self-pay

## 2022-02-15 MED ORDER — VENLAFAXINE HCL ER 75 MG PO CP24
75.0000 mg | ORAL_CAPSULE | Freq: Every day | ORAL | 0 refills | Status: AC
  Filled 2022-02-15: qty 30, 30d supply, fill #0
  Filled 2022-03-20: qty 30, 30d supply, fill #1
  Filled 2022-10-16: qty 30, 30d supply, fill #2

## 2022-03-05 ENCOUNTER — Other Ambulatory Visit (HOSPITAL_COMMUNITY): Payer: Self-pay

## 2022-03-20 ENCOUNTER — Other Ambulatory Visit (HOSPITAL_COMMUNITY): Payer: Self-pay

## 2022-04-20 ENCOUNTER — Other Ambulatory Visit (HOSPITAL_COMMUNITY): Payer: Self-pay

## 2022-04-20 MED ORDER — VENLAFAXINE HCL ER 75 MG PO CP24
75.0000 mg | ORAL_CAPSULE | Freq: Every day | ORAL | 0 refills | Status: DC
  Filled 2022-04-20 – 2022-04-23 (×2): qty 30, 30d supply, fill #0
  Filled 2022-05-19 (×2): qty 30, 30d supply, fill #1
  Filled 2022-06-15: qty 30, 30d supply, fill #2

## 2022-04-20 MED ORDER — MOUNJARO 2.5 MG/0.5ML ~~LOC~~ SOAJ: 2.5000 mg | SUBCUTANEOUS | 0 refills | Status: AC

## 2022-04-23 ENCOUNTER — Other Ambulatory Visit (HOSPITAL_COMMUNITY): Payer: Self-pay

## 2022-05-01 ENCOUNTER — Other Ambulatory Visit (HOSPITAL_COMMUNITY): Payer: Self-pay

## 2022-05-10 ENCOUNTER — Other Ambulatory Visit (HOSPITAL_COMMUNITY): Payer: Self-pay

## 2022-05-10 MED ORDER — NORGESTIMATE-ETH ESTRADIOL 0.25-35 MG-MCG PO TABS
1.0000 | ORAL_TABLET | Freq: Every day | ORAL | 0 refills | Status: AC
Start: 2022-05-10 — End: ?
  Filled 2022-05-10: qty 28, 28d supply, fill #0
  Filled 2022-06-05: qty 28, 28d supply, fill #1

## 2022-05-19 ENCOUNTER — Other Ambulatory Visit (HOSPITAL_COMMUNITY): Payer: Self-pay

## 2022-06-05 ENCOUNTER — Other Ambulatory Visit (HOSPITAL_COMMUNITY): Payer: Self-pay

## 2022-06-15 ENCOUNTER — Other Ambulatory Visit (HOSPITAL_COMMUNITY): Payer: Self-pay

## 2022-07-02 ENCOUNTER — Other Ambulatory Visit (HOSPITAL_COMMUNITY): Payer: Self-pay

## 2022-07-16 ENCOUNTER — Other Ambulatory Visit (HOSPITAL_COMMUNITY): Payer: Self-pay

## 2022-07-17 ENCOUNTER — Other Ambulatory Visit (HOSPITAL_COMMUNITY): Payer: Self-pay

## 2022-07-17 MED ORDER — VENLAFAXINE HCL ER 75 MG PO CP24
75.0000 mg | ORAL_CAPSULE | Freq: Every day | ORAL | 0 refills | Status: DC
  Filled 2022-07-17: qty 30, 30d supply, fill #0
  Filled 2022-11-14: qty 30, 30d supply, fill #1
  Filled 2022-12-14: qty 30, 30d supply, fill #2

## 2022-08-06 ENCOUNTER — Other Ambulatory Visit (HOSPITAL_COMMUNITY): Payer: Self-pay

## 2022-08-16 ENCOUNTER — Other Ambulatory Visit (HOSPITAL_COMMUNITY): Payer: Self-pay

## 2022-08-16 MED ORDER — VENLAFAXINE HCL ER 75 MG PO CP24
75.0000 mg | ORAL_CAPSULE | Freq: Every day | ORAL | 0 refills | Status: DC
  Filled 2022-08-16: qty 30, 30d supply, fill #0
  Filled 2022-09-18: qty 30, 30d supply, fill #1
  Filled 2023-04-19: qty 30, 30d supply, fill #2

## 2022-08-16 MED ORDER — MOUNJARO 2.5 MG/0.5ML ~~LOC~~ SOAJ
2.5000 mg | SUBCUTANEOUS | 0 refills | Status: AC
  Filled 2022-08-16: qty 2, 28d supply, fill #0

## 2022-08-17 ENCOUNTER — Other Ambulatory Visit (HOSPITAL_COMMUNITY): Payer: Self-pay

## 2022-08-17 MED ORDER — ERGOCALCIFEROL 1.25 MG (50000 UT) PO CAPS
1.0000 | ORAL_CAPSULE | ORAL | 0 refills | Status: AC
Start: 2022-08-17 — End: ?
  Filled 2022-08-17: qty 4, 28d supply, fill #0

## 2022-08-18 ENCOUNTER — Other Ambulatory Visit (HOSPITAL_COMMUNITY): Payer: Self-pay

## 2022-09-18 ENCOUNTER — Other Ambulatory Visit (HOSPITAL_COMMUNITY): Payer: Self-pay

## 2022-10-16 ENCOUNTER — Other Ambulatory Visit (HOSPITAL_COMMUNITY): Payer: Self-pay

## 2022-11-14 ENCOUNTER — Other Ambulatory Visit (HOSPITAL_COMMUNITY): Payer: Self-pay

## 2022-12-14 ENCOUNTER — Other Ambulatory Visit (HOSPITAL_COMMUNITY): Payer: Self-pay

## 2022-12-19 ENCOUNTER — Other Ambulatory Visit (HOSPITAL_COMMUNITY): Payer: Self-pay

## 2022-12-19 MED ORDER — HYDROCORTISONE 2.5 % EX CREA
2.5000 | TOPICAL_CREAM | Freq: Two times a day (BID) | CUTANEOUS | 1 refills | Status: AC
  Filled 2022-12-19: qty 45, 30d supply, fill #0
  Filled 2022-12-19: qty 30, 20d supply, fill #0
  Filled 2022-12-31: qty 30, 15d supply, fill #0
  Filled 2022-12-31: qty 45, 30d supply, fill #0

## 2022-12-27 ENCOUNTER — Other Ambulatory Visit (HOSPITAL_COMMUNITY): Payer: Self-pay

## 2022-12-31 ENCOUNTER — Other Ambulatory Visit (HOSPITAL_COMMUNITY): Payer: Self-pay

## 2023-01-08 ENCOUNTER — Other Ambulatory Visit (HOSPITAL_COMMUNITY): Payer: Self-pay

## 2023-01-18 ENCOUNTER — Other Ambulatory Visit (HOSPITAL_COMMUNITY): Payer: Self-pay

## 2023-01-18 MED ORDER — VENLAFAXINE HCL ER 75 MG PO CP24
75.0000 mg | ORAL_CAPSULE | Freq: Every day | ORAL | 0 refills | Status: AC
  Filled 2023-01-18: qty 90, 90d supply, fill #0

## 2023-04-19 ENCOUNTER — Other Ambulatory Visit (HOSPITAL_COMMUNITY): Payer: Self-pay

## 2023-05-17 ENCOUNTER — Other Ambulatory Visit (HOSPITAL_COMMUNITY): Payer: Self-pay

## 2023-05-20 ENCOUNTER — Other Ambulatory Visit (HOSPITAL_COMMUNITY): Payer: Self-pay

## 2023-05-20 MED ORDER — VENLAFAXINE HCL ER 75 MG PO CP24
75.0000 mg | ORAL_CAPSULE | Freq: Every day | ORAL | 0 refills | Status: DC
  Filled 2023-05-20 (×2): qty 30, 30d supply, fill #0
  Filled 2023-06-19: qty 30, 30d supply, fill #1
  Filled 2023-07-18 (×2): qty 30, 30d supply, fill #2

## 2023-05-28 ENCOUNTER — Other Ambulatory Visit (HOSPITAL_COMMUNITY): Payer: Self-pay

## 2023-05-28 ENCOUNTER — Other Ambulatory Visit: Payer: Self-pay

## 2023-05-28 MED ORDER — MOUNJARO 2.5 MG/0.5ML ~~LOC~~ SOAJ
2.5000 mg | SUBCUTANEOUS | 0 refills | Status: AC
  Filled 2023-05-28 – 2023-07-02 (×2): qty 2, 28d supply, fill #0

## 2023-05-29 ENCOUNTER — Other Ambulatory Visit (HOSPITAL_COMMUNITY): Payer: Self-pay

## 2023-05-29 MED ORDER — ERGOCALCIFEROL 1.25 MG (50000 UT) PO CAPS
1.0000 | ORAL_CAPSULE | ORAL | 0 refills | Status: AC
  Filled 2023-05-29: qty 4, 28d supply, fill #0

## 2023-06-07 ENCOUNTER — Other Ambulatory Visit (HOSPITAL_COMMUNITY): Payer: Self-pay

## 2023-06-19 ENCOUNTER — Other Ambulatory Visit (HOSPITAL_COMMUNITY): Payer: Self-pay

## 2023-06-20 ENCOUNTER — Other Ambulatory Visit (HOSPITAL_COMMUNITY): Payer: Self-pay

## 2023-07-02 ENCOUNTER — Other Ambulatory Visit (HOSPITAL_COMMUNITY): Payer: Self-pay

## 2023-07-08 ENCOUNTER — Other Ambulatory Visit (HOSPITAL_COMMUNITY): Payer: Self-pay

## 2023-07-18 ENCOUNTER — Other Ambulatory Visit (HOSPITAL_COMMUNITY): Payer: Self-pay

## 2023-07-19 ENCOUNTER — Other Ambulatory Visit: Payer: Self-pay

## 2023-08-04 IMAGING — MG MM DIGITAL SCREENING BILAT W/ TOMO AND CAD
8 series · 9 of 24 positions shown · non-contrast
Comparison: Previous exam(s).

CLINICAL DATA: Screening.

EXAM:
DIGITAL SCREENING BILATERAL MAMMOGRAM WITH TOMOSYNTHESIS AND CAD
TECHNIQUE: Bilateral screening digital craniocaudal and mediolateral oblique
mammograms were obtained. Bilateral screening digital breast
tomosynthesis was performed. The images were evaluated with
computer-aided detection.

[L MLO synth-2D]
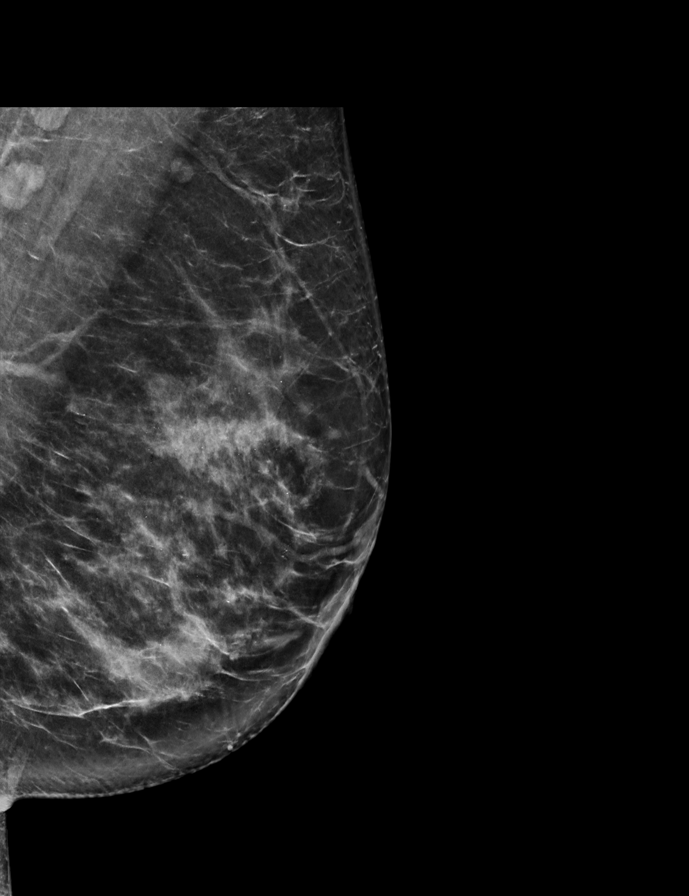

[R MLO synth-2D]
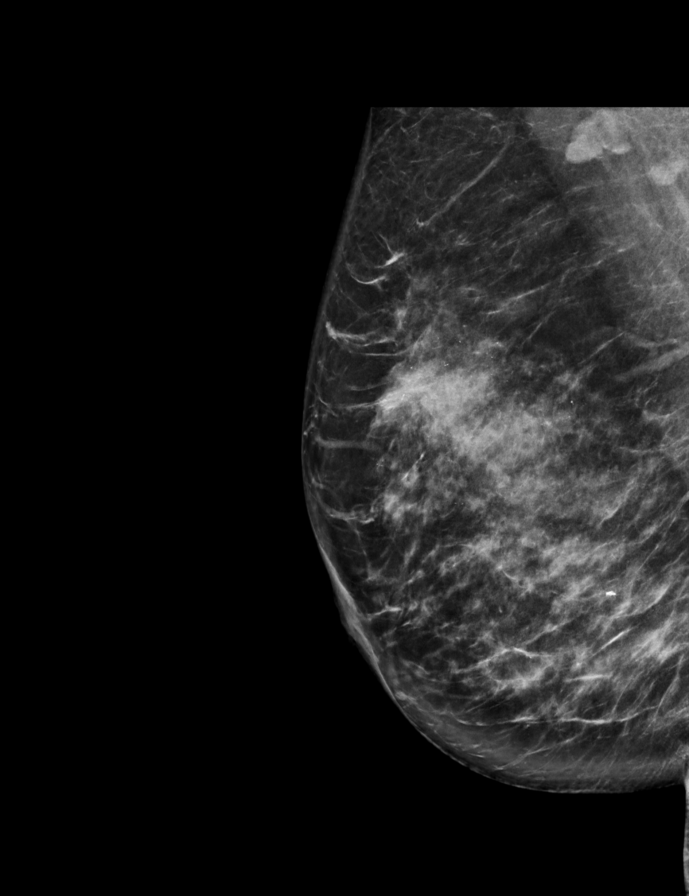

[L CC synth-2D]
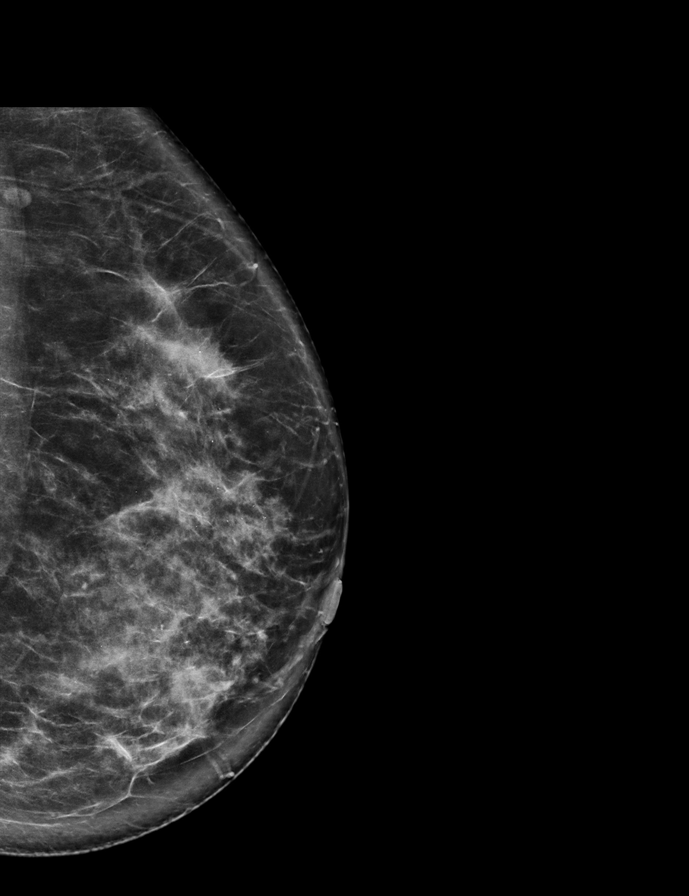

[R CC synth-2D]
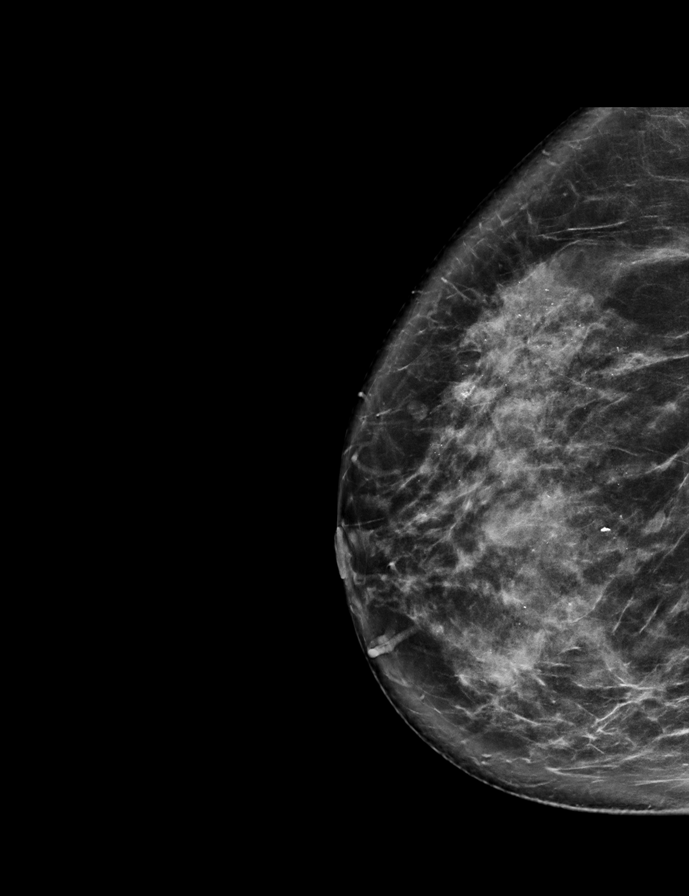

[L CC tomo · 2 of 78 frames shown]
[frame 26/78]
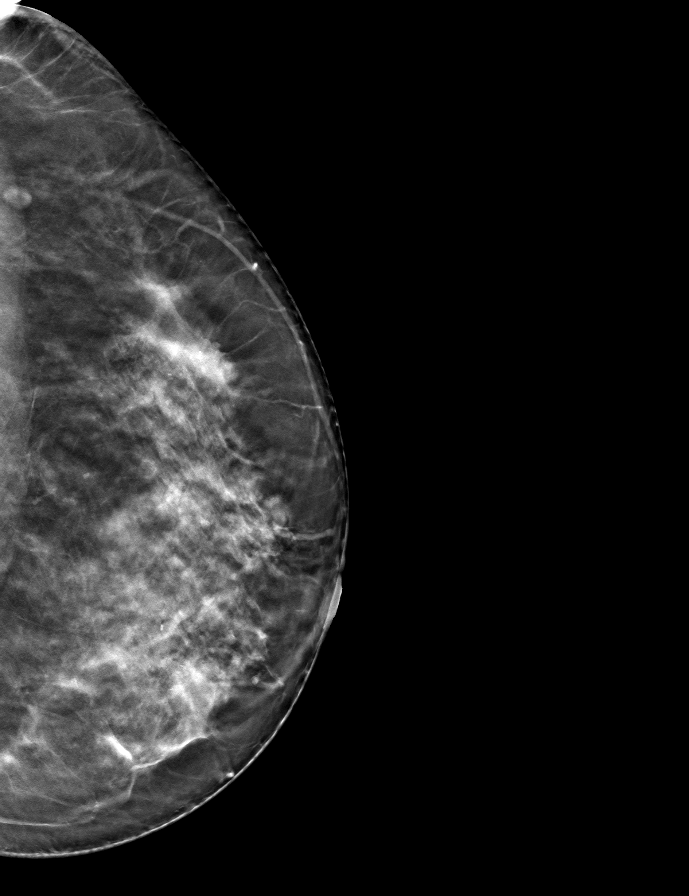
[frame 39/78]
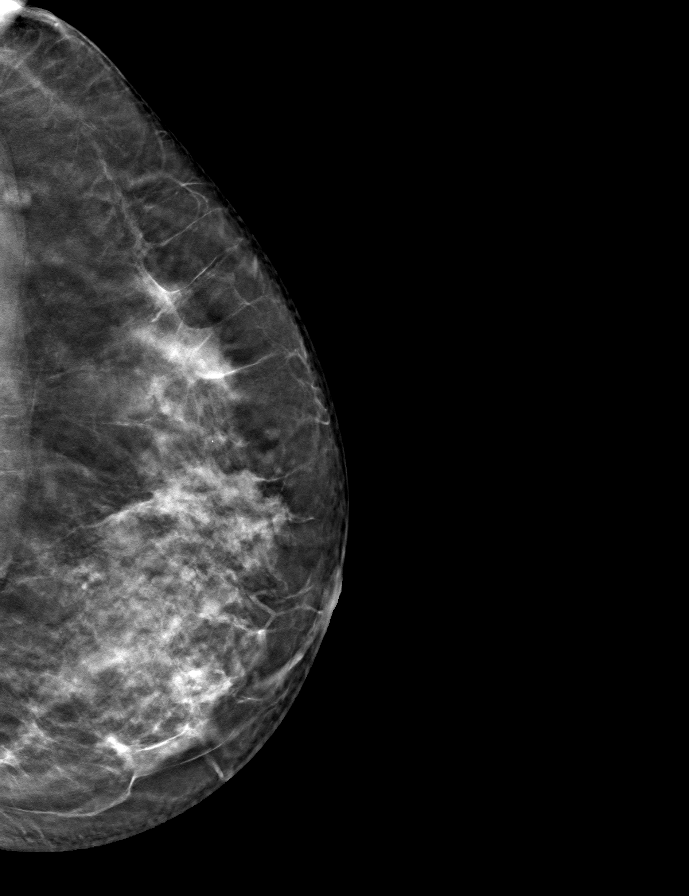

[R MLO tomo · tomo slice 39/78.0]
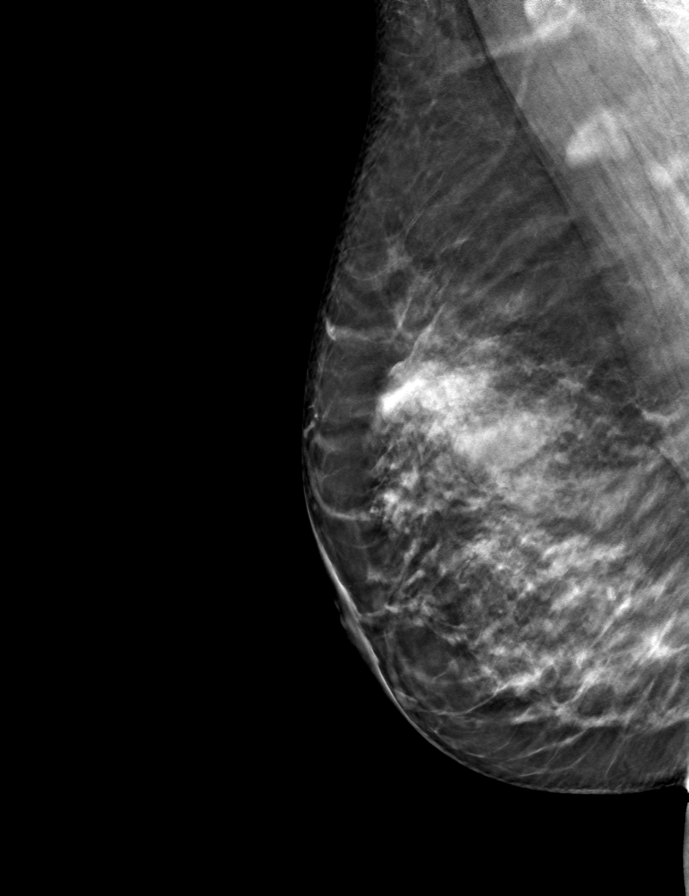

[L MLO tomo · tomo slice 39/78.0]
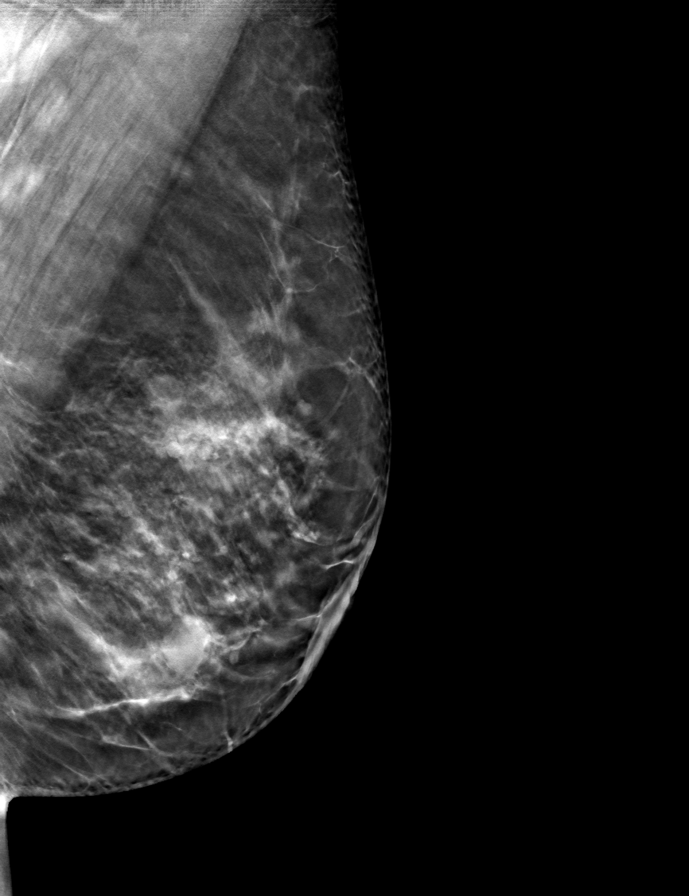

[R CC tomo · tomo slice 41/82.0]
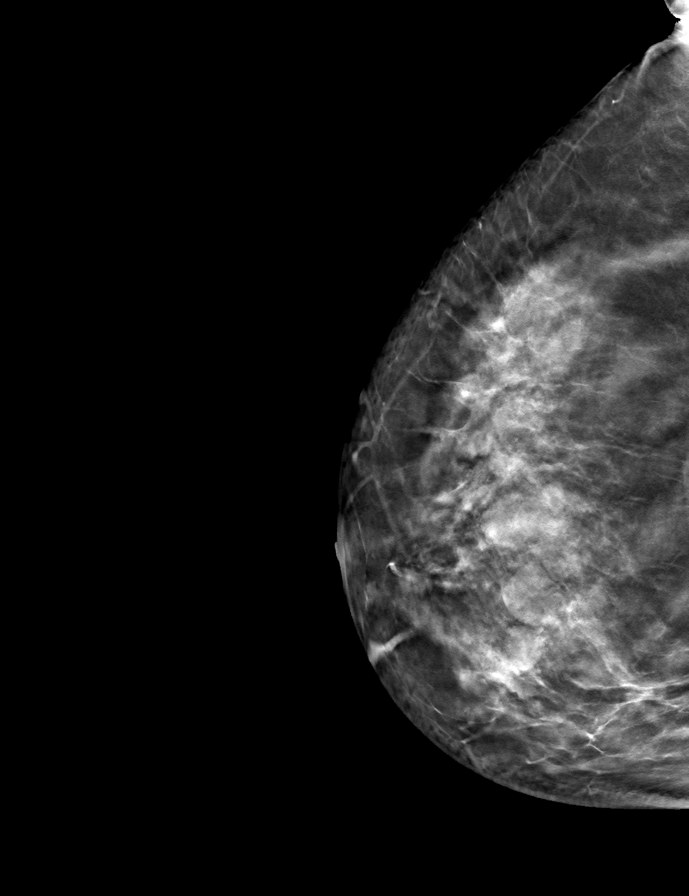

[9 of 24 positions shown; findings below may reference images not displayed]

ACR Breast Density Category d: The breast tissue is extremely dense,
which lowers the sensitivity of mammography
FINDINGS: There are no findings suspicious for malignancy.
IMPRESSION: No mammographic evidence of malignancy. A result letter of this
screening mammogram will be mailed directly to the patient.

RECOMMENDATION:
Screening mammogram in one year. (Code:TA-V-WV9)

BI-RADS CATEGORY  1: Negative.

## 2023-08-20 ENCOUNTER — Other Ambulatory Visit (HOSPITAL_COMMUNITY): Payer: Self-pay

## 2023-08-21 ENCOUNTER — Other Ambulatory Visit (HOSPITAL_COMMUNITY): Payer: Self-pay

## 2023-08-21 MED ORDER — VENLAFAXINE HCL ER 75 MG PO CP24
75.0000 mg | ORAL_CAPSULE | Freq: Every day | ORAL | 0 refills | Status: DC
  Filled 2023-08-21: qty 30, 30d supply, fill #0
  Filled 2023-09-20: qty 30, 30d supply, fill #1
  Filled 2023-10-22: qty 30, 30d supply, fill #2

## 2023-09-20 ENCOUNTER — Other Ambulatory Visit (HOSPITAL_COMMUNITY): Payer: Self-pay

## 2023-10-22 ENCOUNTER — Other Ambulatory Visit (HOSPITAL_COMMUNITY): Payer: Self-pay

## 2023-11-19 ENCOUNTER — Other Ambulatory Visit (HOSPITAL_COMMUNITY): Payer: Self-pay

## 2023-11-19 MED ORDER — VENLAFAXINE HCL ER 75 MG PO CP24
75.0000 mg | ORAL_CAPSULE | Freq: Every day | ORAL | 0 refills | Status: DC
  Filled 2023-11-19: qty 30, 30d supply, fill #0

## 2023-11-25 ENCOUNTER — Other Ambulatory Visit (HOSPITAL_COMMUNITY): Payer: Self-pay

## 2023-11-26 ENCOUNTER — Other Ambulatory Visit (HOSPITAL_COMMUNITY): Payer: Self-pay

## 2023-11-26 MED ORDER — NALTREXONE HCL 50 MG PO TABS
25.0000 mg | ORAL_TABLET | Freq: Every day | ORAL | 0 refills | Status: AC
  Filled 2023-11-26: qty 15, 30d supply, fill #0

## 2023-12-21 ENCOUNTER — Other Ambulatory Visit (HOSPITAL_COMMUNITY): Payer: Self-pay

## 2023-12-22 MED ORDER — VENLAFAXINE HCL ER 75 MG PO CP24
75.0000 mg | ORAL_CAPSULE | Freq: Every day | ORAL | 0 refills | Status: DC
  Filled 2023-12-22 – 2024-03-20 (×2): qty 30, 30d supply, fill #0
  Filled 2024-04-20: qty 3, 3d supply, fill #1

## 2023-12-23 ENCOUNTER — Other Ambulatory Visit (HOSPITAL_COMMUNITY): Payer: Self-pay

## 2024-01-03 ENCOUNTER — Other Ambulatory Visit (HOSPITAL_COMMUNITY): Payer: Self-pay

## 2024-02-05 ENCOUNTER — Other Ambulatory Visit (HOSPITAL_COMMUNITY): Payer: Self-pay

## 2024-02-05 MED ORDER — ESTRADIOL 0.025 MG/24HR TD PTWK
0.0250 mg | MEDICATED_PATCH | TRANSDERMAL | 3 refills | Status: AC
  Filled 2024-02-05 – 2024-02-17 (×2): qty 4, 28d supply, fill #0
  Filled 2024-03-20: qty 4, 28d supply, fill #1
  Filled 2024-05-05: qty 4, 28d supply, fill #2
  Filled 2024-06-12: qty 4, 28d supply, fill #3
  Filled 2024-07-10: qty 4, 28d supply, fill #4

## 2024-02-05 MED ORDER — PROGESTERONE 200 MG PO CAPS
200.0000 mg | ORAL_CAPSULE | Freq: Every day | ORAL | 3 refills | Status: AC
  Filled 2024-02-05 – 2024-02-17 (×2): qty 30, 30d supply, fill #0
  Filled 2024-03-20: qty 30, 30d supply, fill #1
  Filled 2024-04-17: qty 30, 30d supply, fill #2
  Filled 2024-05-25: qty 30, 30d supply, fill #3
  Filled 2024-06-29: qty 30, 30d supply, fill #4

## 2024-02-16 ENCOUNTER — Other Ambulatory Visit (HOSPITAL_COMMUNITY): Payer: Self-pay

## 2024-02-17 ENCOUNTER — Other Ambulatory Visit: Payer: Self-pay

## 2024-02-17 ENCOUNTER — Other Ambulatory Visit (HOSPITAL_COMMUNITY): Payer: Self-pay

## 2024-03-09 ENCOUNTER — Other Ambulatory Visit: Payer: Self-pay | Admitting: Obstetrics & Gynecology

## 2024-03-09 DIAGNOSIS — Z1231 Encounter for screening mammogram for malignant neoplasm of breast: Secondary | ICD-10-CM

## 2024-03-13 ENCOUNTER — Ambulatory Visit
Admission: RE | Admit: 2024-03-13 | Discharge: 2024-03-13 | Disposition: A | Discharge status: Home / Self Care | Payer: PRIVATE HEALTH INSURANCE | Source: Ambulatory Visit | Attending: Obstetrics & Gynecology | Admitting: Obstetrics & Gynecology

## 2024-03-13 DIAGNOSIS — Z1231 Encounter for screening mammogram for malignant neoplasm of breast: Secondary | ICD-10-CM

## 2024-03-18 ENCOUNTER — Other Ambulatory Visit: Payer: Self-pay | Admitting: Obstetrics & Gynecology

## 2024-03-18 DIAGNOSIS — R928 Other abnormal and inconclusive findings on diagnostic imaging of breast: Secondary | ICD-10-CM

## 2024-03-20 ENCOUNTER — Other Ambulatory Visit (HOSPITAL_COMMUNITY): Payer: Self-pay

## 2024-03-30 ENCOUNTER — Ambulatory Visit
Admission: RE | Admit: 2024-03-30 | Discharge: 2024-03-30 | Disposition: A | Discharge status: Home / Self Care | Payer: PRIVATE HEALTH INSURANCE | Source: Ambulatory Visit | Attending: Obstetrics & Gynecology | Admitting: Obstetrics & Gynecology

## 2024-03-30 DIAGNOSIS — R928 Other abnormal and inconclusive findings on diagnostic imaging of breast: Secondary | ICD-10-CM

## 2024-04-17 ENCOUNTER — Other Ambulatory Visit (HOSPITAL_COMMUNITY): Payer: Self-pay

## 2024-04-17 ENCOUNTER — Other Ambulatory Visit: Payer: Self-pay

## 2024-04-20 ENCOUNTER — Other Ambulatory Visit (HOSPITAL_COMMUNITY): Payer: Self-pay

## 2024-04-20 MED ORDER — VENLAFAXINE HCL ER 75 MG PO CP24
75.0000 mg | ORAL_CAPSULE | Freq: Every day | ORAL | 0 refills | Status: DC
  Filled 2024-04-20: qty 30, 30d supply, fill #0

## 2024-04-23 ENCOUNTER — Other Ambulatory Visit (HOSPITAL_COMMUNITY): Payer: Self-pay

## 2024-05-05 ENCOUNTER — Other Ambulatory Visit (HOSPITAL_COMMUNITY): Payer: Self-pay

## 2024-05-05 MED ORDER — FLUTICASONE PROPIONATE 50 MCG/ACT NA SUSP
1.0000 | Freq: Every day | NASAL | 0 refills | Status: AC | PRN
  Filled 2024-05-05: qty 16, 30d supply, fill #0

## 2024-05-05 MED ORDER — FLUZONE 0.5 ML IM SUSY: 0.5000 mL | PREFILLED_SYRINGE | Freq: Once | INTRAMUSCULAR | 0 refills | Status: AC

## 2024-05-05 MED ORDER — SHINGRIX 50 MCG/0.5ML IM SUSR: 0.5000 mL | Freq: Once | INTRAMUSCULAR | 0 refills | Status: AC

## 2024-05-05 MED ORDER — ADACEL 5-2-15.5 LF-MCG/0.5 IM SUSY: 0.5000 mL | PREFILLED_SYRINGE | Freq: Once | INTRAMUSCULAR | 0 refills | Status: AC

## 2024-05-06 ENCOUNTER — Other Ambulatory Visit (HOSPITAL_COMMUNITY): Payer: Self-pay

## 2024-05-08 ENCOUNTER — Other Ambulatory Visit (HOSPITAL_COMMUNITY): Payer: Self-pay

## 2024-05-11 ENCOUNTER — Other Ambulatory Visit (HOSPITAL_COMMUNITY): Payer: Self-pay

## 2024-05-11 ENCOUNTER — Other Ambulatory Visit: Payer: Self-pay

## 2024-05-11 MED ORDER — VENLAFAXINE HCL ER 75 MG PO CP24
75.0000 mg | ORAL_CAPSULE | Freq: Every day | ORAL | 0 refills | Status: AC
  Filled 2024-05-11 – 2024-07-14 (×2): qty 30, 30d supply, fill #0

## 2024-05-21 ENCOUNTER — Other Ambulatory Visit (HOSPITAL_COMMUNITY): Payer: Self-pay

## 2024-05-25 ENCOUNTER — Other Ambulatory Visit (HOSPITAL_COMMUNITY): Payer: Self-pay

## 2024-06-12 ENCOUNTER — Other Ambulatory Visit (HOSPITAL_COMMUNITY): Payer: Self-pay

## 2024-06-29 ENCOUNTER — Other Ambulatory Visit (HOSPITAL_COMMUNITY): Payer: Self-pay

## 2024-06-29 MED ORDER — WEGOVY 1.5 MG PO TABS
1.5000 mg | ORAL_TABLET | Freq: Every morning | ORAL | 0 refills | Status: AC
  Filled 2024-06-29: qty 30, 30d supply, fill #0

## 2024-06-29 MED ORDER — ONDANSETRON HCL 8 MG PO TABS
8.0000 mg | ORAL_TABLET | Freq: Three times a day (TID) | ORAL | 0 refills | Status: AC | PRN
  Filled 2024-06-29: qty 30, 10d supply, fill #0

## 2024-07-10 ENCOUNTER — Other Ambulatory Visit (HOSPITAL_COMMUNITY): Payer: Self-pay

## 2024-07-14 ENCOUNTER — Other Ambulatory Visit (HOSPITAL_COMMUNITY): Payer: Self-pay
